# Patient Record
Sex: Male | Born: 1965 | Race: White | Hispanic: No | Marital: Married | Smoking: Former smoker
Health system: Southern US, Community
[De-identification: ages and names within clinical notes are randomized; demographics above are authoritative.]

## PROBLEM LIST (undated history)

## (undated) DIAGNOSIS — E119 Type 2 diabetes mellitus without complications: Secondary | ICD-10-CM

## (undated) DIAGNOSIS — I10 Essential (primary) hypertension: Secondary | ICD-10-CM

## (undated) HISTORY — PX: MASTOIDECTOMY: SHX711

## (undated) HISTORY — PX: WISDOM TOOTH EXTRACTION: SHX21

## (undated) HISTORY — PX: INNER EAR SURGERY: SHX679

## (undated) HISTORY — PX: KNEE SURGERY: SHX244

---

## 2002-05-29 ENCOUNTER — Encounter: Payer: Self-pay | Admitting: Otolaryngology

## 2002-05-29 ENCOUNTER — Ambulatory Visit (HOSPITAL_COMMUNITY): Admission: RE | Admit: 2002-05-29 | Discharge: 2002-05-29 | Payer: Self-pay | Admitting: Otolaryngology

## 2002-07-13 ENCOUNTER — Ambulatory Visit (HOSPITAL_BASED_OUTPATIENT_CLINIC_OR_DEPARTMENT_OTHER): Admission: RE | Admit: 2002-07-13 | Discharge: 2002-07-13 | Payer: Self-pay | Admitting: Otolaryngology

## 2002-07-13 ENCOUNTER — Encounter (INDEPENDENT_AMBULATORY_CARE_PROVIDER_SITE_OTHER): Payer: Self-pay | Admitting: *Deleted

## 2005-12-30 ENCOUNTER — Emergency Department (HOSPITAL_COMMUNITY): Admission: EM | Admit: 2005-12-30 | Discharge: 2005-12-30 | Payer: Self-pay | Admitting: Emergency Medicine

## 2008-08-17 ENCOUNTER — Emergency Department (HOSPITAL_COMMUNITY): Admission: EM | Admit: 2008-08-17 | Discharge: 2008-08-17 | Payer: Self-pay | Admitting: Emergency Medicine

## 2010-12-15 NOTE — Op Note (Signed)
NAME:  Evan Reyes, Evan Reyes                     ACCOUNT NO.:  [id number]   MEDICAL RECORD NO.:  [id number]                   PATIENT TYPE:  AMB   LOCATION:  DSC                                  FACILITY:  [facility]   PHYSICIAN:  Jefry H. Pollyann Kennedy, M.D.                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  discontinuity.  Conductive hearing loss.  Chronic otorrhea.  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  discontinuity.  Conductive hearing loss.  Chronic otorrhea.  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   PROCEDURE:  Left tympanoplasty/mastoidectomy.   SURGEON:  Jefry H. Pollyann Kennedy, M.D.   ANESTHESIA:  General endotracheal anesthesia.   COMPLICATIONS:  None.   ESTIMATED BLOOD LOSS:  25 cc.   FINDINGS:  Cholesteatoma arising from the posterior superior tympanic  membrane with erosion into the epitympanum around the ossicular chain and  into the mastoid antrum.  The facial nerve canal was completely intact.  The  Otic capsule was completely intact as well.  The incus long process was  completely eroded away. There was discontinuity between the incus body and  the stapes capitulum.  The stapes superstructure was completely surrounded  with granulation tissue and edematous mucosa, but no definite cholesteatoma.  The patient tolerated the procedure well, was awakened, extubated, and  transferred to the recovery room in stable condition.   INDICATIONS FOR PROCEDURE:  The patient is a 45 year old with a several-year  history of chronic left ear drainage and hearing loss.  Risks, benefits,  alternatives, and  complications of the procedure were explained to the  patient and his wife who seemed to understand and agreed to surgery.   DESCRIPTION OF PROCEDURE:  The patient was taken to the operating room and  placed on the operating table in the supine position.  Following the  induction of general endotracheal anesthesia, the left ear was prepped and  draped in the usual sterile fashion.  Four quadrant ear canal injections  with 1% Xylocaine with epinephrine was accomplished.  Postauricular sulcus  was also injected with the same solution.  A total of 8 cc was used.  A  vascular strip was created in the ear canal.  The postauricular incision was  performed with a #10 scalpel.  The ear was brought forward and the tissue  from overlying the temporalis fascia was harvested, pressed and dried on the  back table. The linea temporalis and mastoid periosteum were divided with  the scalpel and the periosteum was brought forward in continuity with the  vascular strip. The mastoid cortex was exposed.  A complete mastoidectomy  was  performed starting off with a large size cutting bur.  The mastoid  cortex bone was thickened up to approximately 1.5 cm.  The mastoid cells  were eventually entered and cholesteatoma matrix was identified within the  anterior superior mastoid and the antrum area.  The horizontal semicircular  canal was identified and exposed keeping the Otic capsule intact.  The  cholesteatoma was carefully dissected off of the surrounding bone. There was  a dehiscence of the tegumen, but the dura was kept intact.  The tegumen was  low compared to the superior aspect of the medial mastoid cavity.  The  cholesteatoma was located in this medial area.  It was carefully dissected  out of the surrounding bony confines and followed up into the incus fossa.  Inspection through the ear canal was then accomplished to try to visualize  the ossicular chain and the fallopian canal.  At this point, the  decision  was made to take down the posterior canal wall.  This was taken all the way  down to the fallopian canal basically which was again without any  dehiscence.  The cholesteatoma matrix was carefully dissected and followed  anteriorly.  The ossicular chain complex with cholesteatoma was dissected  off of the stapes superstructure which seemed to be free of cholesteatoma.  This was kept in place.  The incus was disarticulated from the malleus head  and was retrieved with surrounding cholesteatoma matrix.  The malleus  dimpers were then used to excise the malleus head and also separating that  from the lateral process of the tympanic membrane.  The cochleariform  process was identified and that seemed to be the anterior extent of the  cholesteatoma.  The remainder of the cholesteatoma matrix was all carefully  dissected off.  There did not appear to be any remnants of epithelium.  The  specimen was sent for pathologic evaluation.  Combination of cutting and  polishing burs were used to smooth out the entire mastoid at the tympanic  cavity.  The middle ear and mastoid cavity were capped with Gelfoam, soaked  in saline in the middle ear and soaked in Cortisporin in the mastoid cavity.  The tissue grafts were then placed beneath the tympanic membrane remnant and  draped over the facial ridge and into the mastoid cavity.  The remainder of  the mastoid cavity was packed.  A large tympanoplasty was created using  superior and inferior incisions through the incisura and tacking back the  posterior tonsil bowl and canal skin on itself using Vicryl suture.  The  postauricular incision was reapproximated with subcutaneous chromic suture.  Benzoin and Steri-Strips were applied.  The ear canal was packed with  bacitracin and a cottonball and a mastoid dressing was applied.  The patient  was then awakened, extubated, and transferred to the recovery room in good condition.                                                Jefry H. Pollyann Kennedy, M.D.    JHR/MEDQ  D:  07/13/2002  T:  07/13/2002  Job:  [id number]

## 2016-08-06 DIAGNOSIS — E119 Type 2 diabetes mellitus without complications: Secondary | ICD-10-CM | POA: Diagnosis not present

## 2016-09-22 ENCOUNTER — Encounter (HOSPITAL_COMMUNITY): Payer: Self-pay | Admitting: Emergency Medicine

## 2016-09-22 ENCOUNTER — Emergency Department (HOSPITAL_COMMUNITY)
Admission: EM | Admit: 2016-09-22 | Discharge: 2016-09-22 | Disposition: A | Discharge status: Home / Self Care | Payer: 59 | Attending: Emergency Medicine | Admitting: Emergency Medicine

## 2016-09-22 DIAGNOSIS — J111 Influenza due to unidentified influenza virus with other respiratory manifestations: Secondary | ICD-10-CM

## 2016-09-22 DIAGNOSIS — R05 Cough: Secondary | ICD-10-CM | POA: Diagnosis present

## 2016-09-22 DIAGNOSIS — E119 Type 2 diabetes mellitus without complications: Secondary | ICD-10-CM | POA: Insufficient documentation

## 2016-09-22 DIAGNOSIS — Z87891 Personal history of nicotine dependence: Secondary | ICD-10-CM | POA: Diagnosis not present

## 2016-09-22 DIAGNOSIS — I10 Essential (primary) hypertension: Secondary | ICD-10-CM | POA: Diagnosis not present

## 2016-09-22 HISTORY — DX: Type 2 diabetes mellitus without complications: E11.9

## 2016-09-22 HISTORY — DX: Essential (primary) hypertension: I10

## 2016-09-22 MED ORDER — OSELTAMIVIR PHOSPHATE 75 MG PO CAPS: 75.0000 mg | ORAL_CAPSULE | Freq: Two times a day (BID) | ORAL | 0 refills | Status: AC

## 2016-09-22 MED ORDER — OSELTAMIVIR PHOSPHATE 75 MG PO CAPS
75.0000 mg | ORAL_CAPSULE | Freq: Once | ORAL | Status: AC
  Administered 2016-09-22: 75 mg via ORAL
  Filled 2016-09-22: qty 1

## 2016-09-22 NOTE — ED Notes (Signed)
Pt reports that he has had a flu shot in ?October- is followed by Dr Sherwood GamblerFusco, is DM2, has HTN and has been having viral sx since last night at 2300  Education: ibuprofen and tylenol for fever and aches,  lights dimmed, spouse at bedside

## 2016-09-22 NOTE — ED Triage Notes (Signed)
Pt c/o generalized body aches, chills, & fever that began last night. Pt HR 127 in triage. Pt took Tylenol PTA.

## 2016-09-22 NOTE — Discharge Instructions (Signed)
As discussed, your evaluation today has been largely reassuring.  But, it is important that you monitor your condition carefully, and do not hesitate to return to the ED if you develop new, or concerning changes in your condition. ? ?Otherwise, please follow-up with your physician for appropriate ongoing care. ? ?

## 2016-09-22 NOTE — ED Provider Notes (Signed)
[facility]Y DEPT Provider Note   CSN: [id number] Arrival date & time: 09/22/16  1030     History   Chief Complaint Chief Complaint  Patient presents with  . Generalized Body Aches    HPI Evan Reyes is a 51 y.o. male.  HPI  Patient presents with about 15 hours of illness. Patient was well prior to the onset of illness. Now, he has diffuse body aches, anorexia, weakness, mild cough. Patient works as a Naval architect, states that he is generally well, however. He did receive his influenza shot 5 months ago. There is associated subjective fever. No relief with OTC medication, rest, minimal improvement with Tylenol.   Past Medical History:  Diagnosis Date  . Diabetes mellitus without complication (HCC)   . Hypertension     There are no active problems to display for this patient.   Past Surgical History:  Procedure Laterality Date  . INNER EAR SURGERY    . KNEE SURGERY    . MASTOIDECTOMY    . WISDOM TOOTH EXTRACTION         Home Medications    Prior to Admission medications   Medication Sig Start Date End Date Taking? Authorizing Provider  oseltamivir (TAMIFLU) 75 MG capsule Take 1 capsule (75 mg total) by mouth every 12 (twelve) hours. 09/22/16 09/29/16  Gerhard Munch, MD    Family History No family history on file.  Social History Social History  Substance Use Topics  . Smoking status: Former Games developer  . Smokeless tobacco: Never Used  . Alcohol use No     Allergies   Patient has no known allergies.   Review of Systems Review of Systems  Constitutional:       Per HPI, otherwise negative  HENT:       Per HPI, otherwise negative  Respiratory:       Per HPI, otherwise negative  Cardiovascular:       Per HPI, otherwise negative  Gastrointestinal: Negative for vomiting.  Endocrine:       Negative aside from HPI  Genitourinary:       Neg aside from HPI   Musculoskeletal:       Per HPI, otherwise negative  Skin: Negative.     Allergic/Immunologic: Negative for immunocompromised state.  Neurological: Positive for weakness. Negative for syncope.     Physical Exam Updated Vital Signs BP 124/99 (BP Location: Left Arm)   Pulse 113   Temp 100 F (37.8 C) (Oral)   Resp 18   Ht 6\' 2"  (1.88 m)   Wt 295 lb (133.8 kg)   SpO2 96%   BMI 37.88 kg/m   Physical Exam  Constitutional: He is oriented to person, place, and time. He appears well-developed. No distress.  HENT:  Head: Normocephalic and atraumatic.  Eyes: Conjunctivae and EOM are normal.  Cardiovascular: Regular rhythm.  Tachycardia present.   Pulmonary/Chest: Effort normal. No stridor. No respiratory distress. He has no wheezes.  Abdominal: He exhibits no distension.  Musculoskeletal: He exhibits no edema.  Neurological: He is alert and oriented to person, place, and time.  Skin: Skin is warm and dry.  Psychiatric: He has a normal mood and affect.  Nursing note and vitals reviewed.    ED Treatments / Results   Procedures Procedures (including critical care time)  Medications Ordered in ED Medications  oseltamivir (TAMIFLU) capsule 75 mg (not administered)     Initial Impression / Assessment and Plan / ED Course  I have reviewed the triage vital  signs and the nursing notes.  Pertinent labs & imaging results that were available during my care of the patient were reviewed by me and considered in my medical decision making (see chart for details).  Previously well male presents with less than 1 day of myalgia, weakness, chills, fever. Patient slightly tachycardic, but with no hypertension, and there is low suspicion for bacteremia, sepsis. Patient empirically treated for influenza. I had a lengthy conversation with patient's wife about all monitoring, return precautions, and he was discharged in stable condition.  Final Clinical Impressions(s) / ED Diagnoses   Final diagnoses:  Influenza    New Prescriptions New Prescriptions    OSELTAMIVIR (TAMIFLU) 75 MG CAPSULE    Take 1 capsule (75 mg total) by mouth every 12 (twelve) hours.     Gerhard Munchobert Brown Dunlap, MD 09/22/16 [phone]

## 2016-09-27 DIAGNOSIS — G4733 Obstructive sleep apnea (adult) (pediatric): Secondary | ICD-10-CM | POA: Diagnosis not present

## 2016-12-17 DIAGNOSIS — Z1389 Encounter for screening for other disorder: Secondary | ICD-10-CM | POA: Diagnosis not present

## 2016-12-17 DIAGNOSIS — E782 Mixed hyperlipidemia: Secondary | ICD-10-CM | POA: Diagnosis not present

## 2016-12-17 DIAGNOSIS — Z23 Encounter for immunization: Secondary | ICD-10-CM | POA: Diagnosis not present

## 2016-12-17 DIAGNOSIS — E119 Type 2 diabetes mellitus without complications: Secondary | ICD-10-CM | POA: Diagnosis not present

## 2016-12-17 DIAGNOSIS — Z0000 Encounter for general adult medical examination without abnormal findings: Secondary | ICD-10-CM | POA: Diagnosis not present

## 2017-03-18 DIAGNOSIS — S233XXA Sprain of ligaments of thoracic spine, initial encounter: Secondary | ICD-10-CM | POA: Diagnosis not present

## 2017-03-18 DIAGNOSIS — I10 Essential (primary) hypertension: Secondary | ICD-10-CM | POA: Diagnosis not present

## 2017-03-18 DIAGNOSIS — E119 Type 2 diabetes mellitus without complications: Secondary | ICD-10-CM | POA: Diagnosis not present

## 2017-03-25 ENCOUNTER — Other Ambulatory Visit (HOSPITAL_COMMUNITY): Payer: Self-pay | Admitting: Family Medicine

## 2017-03-25 DIAGNOSIS — I10 Essential (primary) hypertension: Secondary | ICD-10-CM | POA: Diagnosis not present

## 2017-03-25 DIAGNOSIS — R52 Pain, unspecified: Secondary | ICD-10-CM

## 2017-03-25 DIAGNOSIS — E119 Type 2 diabetes mellitus without complications: Secondary | ICD-10-CM | POA: Diagnosis not present

## 2017-04-04 DIAGNOSIS — G4733 Obstructive sleep apnea (adult) (pediatric): Secondary | ICD-10-CM | POA: Diagnosis not present

## 2017-06-10 DIAGNOSIS — Z23 Encounter for immunization: Secondary | ICD-10-CM | POA: Diagnosis not present

## 2017-06-16 DIAGNOSIS — A09 Infectious gastroenteritis and colitis, unspecified: Secondary | ICD-10-CM | POA: Diagnosis not present

## 2017-06-16 DIAGNOSIS — E119 Type 2 diabetes mellitus without complications: Secondary | ICD-10-CM | POA: Diagnosis not present

## 2017-06-16 DIAGNOSIS — R112 Nausea with vomiting, unspecified: Secondary | ICD-10-CM | POA: Diagnosis not present

## 2017-09-30 DIAGNOSIS — G4733 Obstructive sleep apnea (adult) (pediatric): Secondary | ICD-10-CM | POA: Diagnosis not present

## 2018-03-03 DIAGNOSIS — Z0000 Encounter for general adult medical examination without abnormal findings: Secondary | ICD-10-CM | POA: Diagnosis not present

## 2018-03-03 DIAGNOSIS — E1129 Type 2 diabetes mellitus with other diabetic kidney complication: Secondary | ICD-10-CM | POA: Diagnosis not present

## 2018-03-03 DIAGNOSIS — E559 Vitamin D deficiency, unspecified: Secondary | ICD-10-CM | POA: Diagnosis not present

## 2018-03-03 DIAGNOSIS — E782 Mixed hyperlipidemia: Secondary | ICD-10-CM | POA: Diagnosis not present

## 2018-03-03 DIAGNOSIS — E1165 Type 2 diabetes mellitus with hyperglycemia: Secondary | ICD-10-CM | POA: Diagnosis not present

## 2018-04-03 DIAGNOSIS — G4733 Obstructive sleep apnea (adult) (pediatric): Secondary | ICD-10-CM | POA: Diagnosis not present

## 2018-10-01 DIAGNOSIS — G4733 Obstructive sleep apnea (adult) (pediatric): Secondary | ICD-10-CM | POA: Diagnosis not present

## 2019-01-23 ENCOUNTER — Other Ambulatory Visit: Payer: Self-pay

## 2019-01-23 ENCOUNTER — Ambulatory Visit (HOSPITAL_COMMUNITY)
Admission: RE | Admit: 2019-01-23 | Discharge: 2019-01-23 | Disposition: A | Discharge status: Home / Self Care | Payer: 59 | Source: Ambulatory Visit | Attending: Physician Assistant | Admitting: Physician Assistant

## 2019-01-23 ENCOUNTER — Other Ambulatory Visit (HOSPITAL_COMMUNITY): Payer: Self-pay | Admitting: Physician Assistant

## 2019-01-23 DIAGNOSIS — M12862 Other specific arthropathies, not elsewhere classified, left knee: Secondary | ICD-10-CM | POA: Diagnosis not present

## 2019-08-12 ENCOUNTER — Encounter (HOSPITAL_COMMUNITY): Payer: Self-pay | Admitting: Physical Therapy

## 2019-08-12 ENCOUNTER — Ambulatory Visit (HOSPITAL_COMMUNITY): Payer: 59 | Attending: Orthopedic Surgery | Admitting: Physical Therapy

## 2019-08-12 ENCOUNTER — Other Ambulatory Visit: Payer: Self-pay

## 2019-08-12 ENCOUNTER — Encounter (INDEPENDENT_AMBULATORY_CARE_PROVIDER_SITE_OTHER): Payer: Self-pay

## 2019-08-12 DIAGNOSIS — M25562 Pain in left knee: Secondary | ICD-10-CM | POA: Insufficient documentation

## 2019-08-12 DIAGNOSIS — R262 Difficulty in walking, not elsewhere classified: Secondary | ICD-10-CM | POA: Diagnosis present

## 2019-08-12 NOTE — Therapy (Signed)
[facility] [address] [place], [place], [address] Phone: [phone]   Fax:  [phone]  Physical Therapy Evaluation  Patient Details  Name: Evan Reyes MRN: [id number] Date of Birth: June 03, 1966 Referring Provider (PT): Durene Romans   Encounter Date: 08/12/2019  PT End of Session - 08/12/19 1612    Visit Number  1    Number of Visits  1    Authorization Type  [employer], no VL or auth    Authorization Time Period  08/12/19 to 08/12/19 - one time visit    PT Start Time  1615    PT Stop Time  1700    PT Time Calculation (min)  45 min    Activity Tolerance  Patient tolerated treatment well    Behavior During Therapy  [facility] for tasks assessed/performed       Past Medical History:  Diagnosis Date  . Diabetes mellitus without complication (HCC)   . Hypertension     Past Surgical History:  Procedure Laterality Date  . INNER EAR SURGERY    . KNEE SURGERY    . MASTOIDECTOMY    . WISDOM TOOTH EXTRACTION      There were no vitals filed for this visit.   Subjective Assessment - 08/12/19 1621    Subjective  Patient reports he had gradual knee pain that started in May 2020 and this was more than his typical aches and pain. States he went to the MD and they pulled a bunch of fluid out of it in September and gave him a cortisone injection. States that he elected to have surgery and had a scope of his left knee on 07/20/19. He was up and walking a couple days after the surgery with minimal pain. He is currently walking a mile everyday and the plan is to get back to work on 08/31/2019. 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    How long can you sit comfortably?  no limitations    How long can you stand comfortably?  as long as he wants to    How long can you walk comfortably?  at least a mile    Patient Stated Goals  to know what he needs to focus on and ot be able to climb in and out of a truck    Currently in  Pain?  Yes    Pain Score  2     Pain Location  Knee    Pain Orientation  Left;Medial    Pain Descriptors / Indicators  Dull;Aching         [facility] PT Assessment - 08/12/19 0001      Assessment   Medical Diagnosis  s/p left knee scope    Referring Provider (PT)  Durene Romans    Onset Date/Surgical Date  07/20/19    Next MD Visit  08/24/19      Precautions   Precautions  None      Restrictions   Weight Bearing Restrictions  Yes    Other Position/Activity Restrictions  WBAT on left      Balance Screen   Has the patient fallen in the past 6 months  No    Has the patient had a decrease in activity level because of a fear of falling?   Yes    Is the patient reluctant to leave their home because of a fear of falling?   No      Cognition  Overall Cognitive Status  Within Functional Limits for tasks assessed      Observation/Other Assessments   Focus on Therapeutic Outcomes (FOTO)   18% limitation      ROM / Strength   AROM / PROM / Strength  AROM;Strength      AROM   AROM Assessment Site  Knee    Right/Left Knee  Right;Left    Right Knee Extension  0    Right Knee Flexion  136    Left Knee Extension  4   lacking   Left Knee Flexion  136   feel different in knee     Strength   Strength Assessment Site  Knee;Ankle;Hip    Right/Left Hip  Right;Left    Right Hip Flexion  5/5    Right Hip Extension  5/5    Right Hip ABduction  5/5    Left Hip Flexion  5/5    Left Hip Extension  5/5    Left Hip ABduction  5/5    Right/Left Knee  Right;Left    Right Knee Flexion  5/5    Right Knee Extension  5/5    Left Knee Flexion  4+/5   achiness in back of knee   Left Knee Extension  4+/5   achiness in front of knee   Right/Left Ankle  Right;Left    Right Ankle Dorsiflexion  5/5    Right Ankle Plantar Flexion  5/5    Left Ankle Dorsiflexion  5/5    Left Ankle Plantar Flexion  5/5      Ambulation/Gait   Ambulation/Gait  Yes    Ambulation/Gait Assistance  7: Independent     Stairs  Yes    Stairs Assistance  7: Independent    Stair Management Technique  No rails;Alternating pattern    Number of Stairs  4    Height of Stairs  7    Gait Comments  aable to step up and down on 12" step to mimic work Diplomatic Services operational officer Assessed  Yes      Static Standing Balance   Static Standing - Comment/# of Minutes  SLS L and R - 30 seconds with minor sway                Objective measurements completed on examination: See above findings.      [facility] Adult PT Treatment/Exercise - 08/12/19 0001      Exercises   Exercises  --   squats, deadlifts, step ups/downs-  practiced in clinic            PT Education - 08/12/19 1710    Education Details  Patient educated in current presentation and limitations. Discussed gradual return to exercise and discussed and reviewed different exercises to perform at home as well as balance exercises. Practiced in clinic    Person(s) Educated  Patient    Methods  Explanation;Demonstration    Comprehension  Verbalized understanding;Returned demonstration       PT Short Term Goals - 08/12/19 1710      PT SHORT TERM GOAL #1   Title  no short term goals as this is one time visit                Plan - 08/12/19 1714    Clinical Impression Statement  Patient presents to therapy after left knee scope on 07/20/19. He presents with great strength, ROM and minimal pain. Patient is already ambulating 1 mile a  day and can ascend and descend steps without limitations or difficulties. Patient is also able to climb up a foot high step and step back down which is required for him to return back to work. Reviewed basic exercises, importance of gradual progression and note overloading knee during rehab at home. Patient does not need therapy at this time as he was educated in home program and presents with no functional limitations at this time.    Personal Factors and Comorbidities  Age;Comorbidity 1;Comorbidity 2     Comorbidities  DB, obesity    Stability/Clinical Decision Making  Stable/Uncomplicated    Clinical Decision Making  Low    Rehab Potential  Excellent    PT Frequency  1x / week    PT Duration  --   1 week   PT Treatment/Interventions  ADLs/Self Care Home Management;Therapeutic activities;Therapeutic exercise    PT Next Visit Plan  one time visit    Consulted and Agree with Plan of Care  Patient       Patient will benefit from skilled therapeutic intervention in order to improve the following deficits and impairments:  Pain  Visit Diagnosis: Acute pain of left knee  Difficulty in walking, not elsewhere classified     Problem List There are no problems to display for this patient.  5:16 PM, 08/12/19 Jerene Pitch, DPT Physical Therapy with [facility]  [phone] office  [place] [address] [place], [place], [address] Phone: [phone]   Fax:  [phone]  Name: COVEY BALLER MRN: [id number] Date of Birth: 1966/03/01

## 2019-10-04 ENCOUNTER — Ambulatory Visit: Payer: 59 | Attending: Internal Medicine

## 2019-10-04 DIAGNOSIS — Z23 Encounter for immunization: Secondary | ICD-10-CM | POA: Insufficient documentation

## 2019-10-04 NOTE — Progress Notes (Signed)
   Covid-19 Vaccination Clinic  Name:  QURAN VASCO    MRN: [id number] DOB: [birth year]  10/04/2019  Mr. Buenger was observed post Covid-19 immunization for 15 minutes without incident. He was provided with Vaccine Information Sheet and instruction to access the V-Safe system.   Mr. Hanrahan was instructed to call 911 with any severe reactions post vaccine: Marland Kitchen Difficulty breathing  . Swelling of face and throat  . A fast heartbeat  . A bad rash all over body  . Dizziness and weakness   Immunizations Administered    Name Date Dose VIS Date Route   Pfizer COVID-19 Vaccine 10/04/2019 12:08 PM 0.3 mL 07/10/2019 Intramuscular   Manufacturer: [employer], [employer]   Lot: [id number]   NDC: [address]-5

## 2019-10-25 ENCOUNTER — Ambulatory Visit: Payer: Self-pay | Attending: Internal Medicine

## 2019-10-25 DIAGNOSIS — Z23 Encounter for immunization: Secondary | ICD-10-CM

## 2019-10-25 NOTE — Progress Notes (Signed)
   Covid-19 Vaccination Clinic  Name:  Evan Reyes    MRN: [id number] DOB: [birth year]  10/25/2019  Mr. Delancey was observed post Covid-19 immunization for 15 minutes without incident. He was provided with Vaccine Information Sheet and instruction to access the V-Safe system.   Mr. Toste was instructed to call 911 with any severe reactions post vaccine: Marland Kitchen Difficulty breathing  . Swelling of face and throat  . A fast heartbeat  . A bad rash all over body  . Dizziness and weakness   Immunizations Administered    Name Date Dose VIS Date Route   Pfizer COVID-19 Vaccine 10/25/2019 10:56 AM 0.3 mL 07/10/2019 Intramuscular   Manufacturer: [employer], [employer]   Lot: [id number]   NDC: [address]-0

## 2019-11-30 ENCOUNTER — Encounter: Payer: Self-pay | Admitting: Emergency Medicine

## 2019-11-30 ENCOUNTER — Ambulatory Visit
Admission: EM | Admit: 2019-11-30 | Discharge: 2019-11-30 | Disposition: A | Discharge status: Home / Self Care | Payer: 59

## 2019-11-30 ENCOUNTER — Other Ambulatory Visit: Payer: Self-pay

## 2019-11-30 DIAGNOSIS — M545 Low back pain, unspecified: Secondary | ICD-10-CM

## 2019-11-30 MED ORDER — KETOROLAC TROMETHAMINE 60 MG/2ML IM SOLN
60.0000 mg | Freq: Once | INTRAMUSCULAR | Status: AC
  Administered 2019-11-30: 13:00:00 60 mg via INTRAMUSCULAR

## 2019-11-30 MED ORDER — CYCLOBENZAPRINE HCL 10 MG PO TABS
10.0000 mg | ORAL_TABLET | Freq: Two times a day (BID) | ORAL | 0 refills | Status: DC | PRN
Start: 2019-11-30 — End: 2023-11-25

## 2019-11-30 MED ORDER — PREDNISONE 10 MG (21) PO TBPK
ORAL_TABLET | ORAL | 0 refills | Status: DC
Start: 2019-11-30 — End: 2023-11-25

## 2019-11-30 MED ORDER — IBUPROFEN 800 MG PO TABS: 800.0000 mg | ORAL_TABLET | Freq: Three times a day (TID) | ORAL | 0 refills | Status: AC

## 2019-11-30 MED ORDER — METHYLPREDNISOLONE SODIUM SUCC 125 MG IJ SOLR
125.0000 mg | Freq: Once | INTRAMUSCULAR | Status: AC
  Administered 2019-11-30: 13:00:00 125 mg via INTRAMUSCULAR

## 2019-11-30 NOTE — ED Provider Notes (Signed)
[facility]    CSN: [id number] Arrival date & time: 11/30/19  1049      History   Chief Complaint Chief Complaint  Patient presents with  . Back Pain    HPI Nirvan Laban Tilghman is a 54 y.o. male.   Who presented to the urgent care with a complaint of low back pain for the past 2 weeks.  Reported worsening symptoms after mowing.  Localized pain to bilateral lower back.Marland Kitchen  Described the pain as constant constant and achy and rated at 8 on a scale 1-10.  He has tried OTC medications without relief.  His symptoms are made worse with ROM.  He denies similar symptoms in the past.  Denies chills, fever, nausea, vomiting, diarrhea.  The history is provided by the patient. No language interpreter was used.  Back Pain   Past Medical History:  Diagnosis Date  . Diabetes mellitus without complication (HCC)   . Hypertension     There are no problems to display for this patient.   Past Surgical History:  Procedure Laterality Date  . INNER EAR SURGERY    . KNEE SURGERY    . MASTOIDECTOMY    . WISDOM TOOTH EXTRACTION         Home Medications    Prior to Admission medications   Medication Sig Start Date End Date Taking? Authorizing Provider  aspirin 81 MG chewable tablet Chew by mouth.    [provider]  cyclobenzaprine (FLEXERIL) 10 MG tablet Take 1 tablet (10 mg total) by mouth 2 (two) times daily as needed for muscle spasms. 11/30/19   Phila Shoaf, Zachery Dakins, FNP  glimepiride (AMARYL) 2 MG tablet Take 2 mg by mouth daily. 09/18/19   [provider]  ibuprofen (ADVIL) 800 MG tablet Take 1 tablet (800 mg total) by mouth 3 (three) times daily. 11/30/19   Berda Shelvin, Zachery Dakins, FNP  losartan (COZAAR) 50 MG tablet losartan 50 mg tablet    [provider]  Multiple Vitamins-Minerals (THERA-M) TABS Take by mouth.    [provider]  Omega-3 Fatty Acids (FISH OIL) 1000 MG CAPS Take by mouth.    [provider]  predniSONE (STERAPRED  UNI-PAK 21 TAB) 10 MG (21) TBPK tablet Take 6 tabs by mouth daily  for 2 days, then 5 tabs for 2 days, then 4 tabs for 2 days, then 3 tabs for 2 days, 2 tabs for 2 days, then 1 tab by mouth daily for 2 days 11/30/19   Durward Parcel, FNP  simvastatin (ZOCOR) 10 MG tablet Take 10 mg by mouth daily. 09/19/19   [provider]    Family History Family History  Problem Relation Age of Onset  . Multiple sclerosis Father     Social History Social History   Tobacco Use  . Smoking status: Former Games developer  . Smokeless tobacco: Never Used  Substance Use Topics  . Alcohol use: No  . Drug use: No     Allergies   Patient has no known allergies.   Review of Systems Review of Systems  Constitutional: Negative.   Respiratory: Negative.   Cardiovascular: Negative.   Musculoskeletal: Positive for back pain.  All other systems reviewed and are negative.    Physical Exam Triage Vital Signs ED Triage Vitals  Enc Vitals Group     BP 11/30/19 1130 (!) 180/90     Pulse Rate 11/30/19 1130 88     Resp 11/30/19 1130 20     Temp 11/30/19  1130 98.3 F (36.8 C)     Temp Source 11/30/19 1130 Oral     SpO2 11/30/19 1130 96 %     Weight --      Height --      Head Circumference --      Peak Flow --      Pain Score 11/30/19 1125 8     Pain Loc --      Pain Edu? --      Excl. in GC? --    No data found.  Updated Vital Signs BP (!) 180/90 (BP Location: Right Arm)   Pulse 88   Temp 98.3 F (36.8 C) (Oral)   Resp 20   SpO2 96%   Visual Acuity Right Eye Distance:   Left Eye Distance:   Bilateral Distance:    Right Eye Near:   Left Eye Near:    Bilateral Near:     Physical Exam Nursing note reviewed.  Constitutional:      General: He is not in acute distress.    Appearance: Normal appearance. He is normal weight. He is not ill-appearing, toxic-appearing or diaphoretic.  Cardiovascular:     Rate and Rhythm: Normal rate and regular rhythm.     Pulses: Normal pulses.      Heart sounds: Normal heart sounds. No murmur. No gallop.   Pulmonary:     Effort: Pulmonary effort is normal. No respiratory distress.     Breath sounds: Normal breath sounds. No stridor. No wheezing, rhonchi or rales.  Chest:     Chest wall: No tenderness.  Musculoskeletal:        General: Tenderness present. Normal range of motion.     Lumbar back: Spasms and tenderness present.     Comments: Back:  Patient ambulates from chair to exam table without difficulty.  Inspection: Skin clear and intact without obvious swelling, erythema, or ecchymosis. Warm to the touch  Palpation: Vertebral processes nontender. Tenderness and spasm to bilateral lower back ROM: Strength: 5/5 hip flexion, 5/5 knee extension, 5/5 knee flexion, 5/5 plantar flexion, 5/5 dorsiflexion  DTR: Patellar tendon reflex intact    Neurological:     Mental Status: He is alert and oriented to person, place, and time.     Cranial Nerves: No cranial nerve deficit.     Sensory: No sensory deficit.     Motor: No weakness.     Coordination: Coordination normal.     Gait: Gait normal.     Deep Tendon Reflexes: Reflexes normal.      UC Treatments / Results  Labs (all labs ordered are listed, but only abnormal results are displayed) Labs Reviewed - No data to display  EKG   Radiology No results found.  Procedures Procedures (including critical care time)  Medications Ordered in UC Medications  ketorolac (TORADOL) injection 60 mg (has no administration in time range)  methylPREDNISolone sodium succinate (SOLU-MEDROL) 125 mg/2 mL injection 125 mg (has no administration in time range)    Initial Impression / Assessment and Plan / UC Course  I have reviewed the triage vital signs and the nursing notes.  Pertinent labs & imaging results that were available during my care of the patient were reviewed by me and considered in my medical decision making (see chart for details).    Patient is stable at discharge.   There is a tenderness and spasm present on lower back.  Will prescribe prednisone, Flexeril and ibuprofen for pain relief.  Final Clinical Impressions(s) / UC  Diagnoses   Final diagnoses:  Acute bilateral low back pain without sciatica     Discharge Instructions     Rest, ice and heat as needed Ensure adequate ROM as tolerated. Prescribed ibuprofen as needed for pain relief Prednisone for inflammation Prescribed flexeril  for muscle spasm.  Do not drive or operate heavy machinery while taking this medication Return here or go to ER if you have any new or worsening symptoms such as numbness/tingling of the inner thighs, loss of bladder or bowel control, headache/blurry vision, nausea/vomiting, confusion/altered mental status, dizziness, weakness, passing out, imbalance, etc...      ED Prescriptions    Medication Sig Dispense Auth. Provider   predniSONE (STERAPRED UNI-PAK 21 TAB) 10 MG (21) TBPK tablet Take 6 tabs by mouth daily  for 2 days, then 5 tabs for 2 days, then 4 tabs for 2 days, then 3 tabs for 2 days, 2 tabs for 2 days, then 1 tab by mouth daily for 2 days 42 tablet Jarvis Knodel S, FNP   ibuprofen (ADVIL) 800 MG tablet Take 1 tablet (800 mg total) by mouth 3 (three) times daily. 30 tablet Eufemia Prindle, Darrelyn Hillock, FNP   cyclobenzaprine (FLEXERIL) 10 MG tablet Take 1 tablet (10 mg total) by mouth 2 (two) times daily as needed for muscle spasms. 20 tablet Boluwatife Mutchler, Darrelyn Hillock, FNP     PDMP not reviewed this encounter.   Emerson Monte, FNP 11/30/19 1229

## 2019-11-30 NOTE — Discharge Instructions (Addendum)
Rest, ice and heat as needed Ensure adequate ROM as tolerated. Prescribed ibuprofen as needed for pain relief Prednisone for inflammation Prescribed flexeril  for muscle spasm.  Do not drive or operate heavy machinery while taking this medication Return here or go to ER if you have any new or worsening symptoms such as numbness/tingling of the inner thighs, loss of bladder or bowel control, headache/blurry vision, nausea/vomiting, confusion/altered mental status, dizziness, weakness, passing out, imbalance, etc..Marland Kitchen

## 2019-11-30 NOTE — ED Triage Notes (Signed)
Complains of back pain.  Back pain started in February 2021.  2 weeks ago, pain worsened.  This past weekend, pain worsened significantly.  No known injury, but did mow lawn over weekend.  Pain in lower back, twinge, dull ache left leg, slightly occurs in right.

## 2021-06-05 ENCOUNTER — Other Ambulatory Visit: Payer: Self-pay

## 2021-06-05 ENCOUNTER — Ambulatory Visit (HOSPITAL_COMMUNITY)
Admission: RE | Admit: 2021-06-05 | Discharge: 2021-06-05 | Disposition: A | Discharge status: Home / Self Care | Payer: 59 | Source: Ambulatory Visit | Attending: Physician Assistant | Admitting: Physician Assistant

## 2021-06-05 ENCOUNTER — Other Ambulatory Visit (HOSPITAL_COMMUNITY): Payer: Self-pay | Admitting: Physician Assistant

## 2021-06-05 DIAGNOSIS — M25561 Pain in right knee: Secondary | ICD-10-CM | POA: Insufficient documentation

## 2021-08-21 ENCOUNTER — Other Ambulatory Visit: Payer: Self-pay

## 2021-08-21 ENCOUNTER — Ambulatory Visit (INDEPENDENT_AMBULATORY_CARE_PROVIDER_SITE_OTHER): Payer: 59 | Admitting: Behavioral Health

## 2021-08-21 ENCOUNTER — Encounter: Payer: Self-pay | Admitting: Behavioral Health

## 2021-08-21 VITALS — BP 154/84 | HR 63 | Ht 75.0 in | Wt 300.0 lb

## 2021-08-21 DIAGNOSIS — R454 Irritability and anger: Secondary | ICD-10-CM

## 2021-08-21 DIAGNOSIS — F439 Reaction to severe stress, unspecified: Secondary | ICD-10-CM | POA: Diagnosis not present

## 2021-08-21 NOTE — Progress Notes (Signed)
[facility] MD/PA/NP Initial Note  08/21/2021 9:38 AM Evan Reyes  MRN:  [id number]  Chief Complaint:  Chief Complaint   Patient Education; Establish Care; Anger; Agitation     HPI:  56 year old male presents to this office for initial visit and to establish care. Says that his wife left him without any warning signs in June of 2021. Says they eventually divorced in Oct. 2022. Says that he has never had any closure because he never contacted her and does not understand why. Says he has harbored deep seeded anger and resentment that he is having trouble getting past. He says that he recently sabotaged a new relationship by lashing out and not being able to control his negative feelings Says that he is not wanting to take any medication but needs psychotherapy. He says his anxiety is 3/10 and depression is 1/10. He is sleeping 7-8 hours per night. No mania, no psychosis, no SI/HI.  No prior psychotropic medications  Visit Diagnosis: No diagnosis found.  Past Psychiatric History: none  Past Medical History:  Past Medical History:  Diagnosis Date   Diabetes mellitus without complication ([place])    Hypertension     Past Surgical History:  Procedure Laterality Date   INNER EAR SURGERY     KNEE SURGERY     MASTOIDECTOMY     WISDOM TOOTH EXTRACTION      Family Psychiatric History: none obtained this visit  Family History:  Family History  Problem Relation Age of Onset   Multiple sclerosis Father     Social History:  Social History   Socioeconomic History   Marital status: Married    Spouse name: Not on file   Number of children: 1   Years of education: 16   Highest education level: Bachelor's degree (e.g., BA, AB, BS)  Occupational History   Not on file  Tobacco Use   Smoking status: Former   Smokeless tobacco: Never  Substance and Sexual Activity   Alcohol use: No   Drug use: No   Sexual activity: Not on file  Other Topics Concern   Not on file  Social  History Narrative   Lives in [place] alone.   Social Determinants of Health   Financial Resource Strain: Not on file  Food Insecurity: Not on file  Transportation Needs: Not on file  Physical Activity: Not on file  Stress: Not on file  Social Connections: Not on file    Allergies: No Known Allergies  Metabolic Disorder Labs: No results found for: HGBA1C, MPG No results found for: PROLACTIN No results found for: CHOL, TRIG, HDL, CHOLHDL, VLDL, LDLCALC No results found for: TSH  Therapeutic Level Labs: No results found for: LITHIUM No results found for: VALPROATE No components found for:  CBMZ  Current Medications: Current Outpatient Medications  Medication Sig Dispense Refill   aspirin 81 MG chewable tablet Chew by mouth.     cyclobenzaprine (FLEXERIL) 10 MG tablet Take 1 tablet (10 mg total) by mouth 2 (two) times daily as needed for muscle spasms. 20 tablet 0   glimepiride (AMARYL) 2 MG tablet Take 2 mg by mouth daily.     ibuprofen (ADVIL) 800 MG tablet Take 1 tablet (800 mg total) by mouth 3 (three) times daily. 30 tablet 0   losartan (COZAAR) 50 MG tablet losartan 50 mg tablet     Multiple Vitamins-Minerals (THERA-M) TABS Take by mouth.     Omega-3 Fatty Acids (FISH OIL) 1000 MG CAPS Take by mouth.  predniSONE (STERAPRED UNI-PAK 21 TAB) 10 MG (21) TBPK tablet Take 6 tabs by mouth daily  for 2 days, then 5 tabs for 2 days, then 4 tabs for 2 days, then 3 tabs for 2 days, 2 tabs for 2 days, then 1 tab by mouth daily for 2 days 42 tablet 0   simvastatin (ZOCOR) 10 MG tablet Take 10 mg by mouth daily.     No current facility-administered medications for this visit.    Medication Side Effects: none  Orders placed this visit:  No orders of the defined types were placed in this encounter.   Psychiatric Specialty Exam:  Review of Systems  Constitutional: Negative.   Allergic/Immunologic: Negative.   Neurological: Negative.   Psychiatric/Behavioral:  Positive  for behavioral problems.    Blood pressure (!) 154/84, pulse 63, height 6\' 3"  (1.905 m), weight 300 lb (136.1 kg).Body mass index is 37.5 kg/m.  General Appearance: Casual  Eye Contact:  Good  Speech:  Clear and Coherent  Volume:  Normal  Mood:  NA  Affect:  Appropriate  Thought Process:  Coherent  Orientation:  Full (Time, Place, and Person)  Thought Content: Logical   Suicidal Thoughts:  No  Homicidal Thoughts:  No  Memory:  WNL  Judgement:  Good  Insight:  Good  Psychomotor Activity:  Normal  Concentration:  Concentration: Good  Recall:  Good  Fund of Knowledge: Good  Language: Good  Assets:  Desire for Improvement  ADL's:  Intact  Cognition: WNL  Prognosis:  Good   Screenings:PH2Q=Negative  Receiving Psychotherapy: No   Treatment Plan/Recommendations:  Greater than 50% of 30 min face to face time with patient was spent on counseling and coordination of care. We discussed his current problems with coping with change post divorce and anger management.  Patient says that he thought he was here for therapy to help him cope with his anger and resentment. He does not want to take medications or continue with medication management. It was determined that psychotherapy would be more appropriate tx at this time. He was referred to [facility] for f/u.         Elwanda Brooklyn, NP

## 2021-10-02 ENCOUNTER — Other Ambulatory Visit: Payer: Self-pay

## 2021-10-02 ENCOUNTER — Ambulatory Visit (INDEPENDENT_AMBULATORY_CARE_PROVIDER_SITE_OTHER): Payer: 59 | Admitting: Mental Health

## 2021-10-02 DIAGNOSIS — F4320 Adjustment disorder, unspecified: Secondary | ICD-10-CM

## 2021-10-02 NOTE — Progress Notes (Signed)
[facility] Counselor Initial Adult Exam ? ?Name: Evan Reyes ?Date: 10/02/2021 ?MRN: [id number] ?DOB: 12-Dec-1965 ?PCP: [facility] ? ?Time spent: 53 minutes ? ?Reason for Visit /Presenting Problem: Patient referred by Avelina Laine, NP in practice for coping skills related to anger issues. 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  They have and adult daughter in college; his daughter informed his taht his ex wife was retiring. After losing money after the divorce, his anger continued to build. This past December, he was dating a lady which was going well until he got angry while communicating via text.  ?To this day, he does not know why his wife left him. They have not spoken since. When his wife left, his adult daughter went with her; they moved locally. Daughter now lives in [place], [place] obtaining Phd.  ?He continues to work as a Naval architect. He wrote a letter to his ex wife forgiving her. States he has "walls" that relate to his suppressing feelings that relate to his anger. ? ? ? ?Mental Status Exam: ?   ?Appearance:    Casual     ?Behavior:   Appropriate  ?Motor:   WNL  ?Speech/Language:    Clear and Coherent  ?Affect:   Full range   ?Mood:   Euthymic  ?Thought process:   Logical, linear, goal directed  ?Thought content:     WNL  ?Sensory/Perceptual disturbances:     none  ?Orientation:   x4  ?Attention:   Good  ?Concentration:   Good  ?Memory:   Intact  ?Fund of knowledge:    Consistent with age and development  ?Insight:     Good  ?Judgment:    Good  ?Impulse Control:   Good  ?  ? ?Reported Symptoms:  some depressed mood, anger/irritability  ? ?Risk Assessment: ?Danger to Self:  No ?Self-injurious Behavior: No ?Danger to Others: No ?Duty to Warn:no ?Physical Aggression / Violence:No  ?Access to Firearms a concern: No  ?Gang Involvement:No  ?Patient / guardian was educated about steps to take if suicide or  homicide risk level increases between visits: yes ?While future psychiatric events cannot be accurately predicted, the patient does not currently require acute inpatient psychiatric care and does not currently meet [facility] involuntary commitment criteria. ? ?Substance Abuse History: ?Current substance abuse: none ? ?Past Psychiatric History:   ?Outpatient Providers: none ?History of Psych Hospitalization: none ?Psychological Testing: none ? ?Abuse History: ?Victim - none stated ? ?Family History:  ?Raised by both parents, father passed in 40 (dxd w/ MS, patient assisted in his care up to his passing).  Sister- age 55 ? ?Family History  ?Problem Relation Age of Onset  ? Multiple sclerosis Father   ? ? ?Living situation: the patient lives alone ? ?Sexual Orientation:  Straight ? ?Relationship Status: divorced  ?            If a parent, number of children / ages: daughter-age 55 ? ?Support Systems; friends, family ? ?Financial Stress:  Yes  ? ?Income/Employment/Disability: Employment- full time truck driver ? ?Military Service: No  ? ?Educational History: ?Education: HS Diploma ? ? ?Stressors: interpersonal ? ?Strengths:  Family, employment ? ?Barriers:  none ? ?Legal History: ?Pending legal issue / charges: none ? ?Medical History/Surgical History: ?Past Medical History:  ?Diagnosis Date  ? Diabetes mellitus without complication (HCC)   ? Hypertension   ? ? ?Past Surgical History:  ?  Procedure Laterality Date  ? INNER EAR SURGERY    ? KNEE SURGERY    ? MASTOIDECTOMY    ? WISDOM TOOTH EXTRACTION    ? ? ?Medications: ?Current Outpatient Medications  ?Medication Sig Dispense Refill  ? aspirin 81 MG chewable tablet Chew by mouth.    ? cyclobenzaprine (FLEXERIL) 10 MG tablet Take 1 tablet (10 mg total) by mouth 2 (two) times daily as needed for muscle spasms. 20 tablet 0  ? glimepiride (AMARYL) 2 MG tablet Take 2 mg by mouth daily.    ? ibuprofen (ADVIL) 800 MG tablet Take 1 tablet (800 mg total) by mouth 3 (three)  times daily. 30 tablet 0  ? losartan (COZAAR) 50 MG tablet losartan 50 mg tablet    ? Multiple Vitamins-Minerals (THERA-M) TABS Take by mouth.    ? Omega-3 Fatty Acids (FISH OIL) 1000 MG CAPS Take by mouth.    ? predniSONE (STERAPRED UNI-PAK 21 TAB) 10 MG (21) TBPK tablet Take 6 tabs by mouth daily  for 2 days, then 5 tabs for 2 days, then 4 tabs for 2 days, then 3 tabs for 2 days, 2 tabs for 2 days, then 1 tab by mouth daily for 2 days 42 tablet 0  ? simvastatin (ZOCOR) 10 MG tablet Take 10 mg by mouth daily.    ? ?No current facility-administered medications for this visit.  ? ? ?No Known Allergies ? ?Diagnoses:  ?No diagnosis found. ? ?Plan of Care: TBD ? ? ?Waldron Session, [facility]  ? ? ? ?

## 2021-11-06 ENCOUNTER — Ambulatory Visit (INDEPENDENT_AMBULATORY_CARE_PROVIDER_SITE_OTHER): Payer: 59 | Admitting: Mental Health

## 2021-11-06 DIAGNOSIS — F4320 Adjustment disorder, unspecified: Secondary | ICD-10-CM

## 2021-11-06 NOTE — Progress Notes (Signed)
[facility] psychotherapy note ? ?Name: Evan Reyes ?Date: 11/06/2021 ?MRN: [id number] ?DOB: 1966-02-03 ?PCP: [facility] ? ?Time spent: 57 minutes ? ?Treatment:   ind. therapy ? ? ? ?Mental Status Exam: ?   ?Appearance:    Casual     ?Behavior:   Appropriate  ?Motor:   WNL  ?Speech/Language:    Clear and Coherent  ?Affect:   Full range   ?Mood:   Euthymic, sad  ?Thought process:   Logical, linear, goal directed  ?Thought content:     WNL  ?Sensory/Perceptual disturbances:     none  ?Orientation:   x4  ?Attention:   Good  ?Concentration:   Good  ?Memory:   Intact  ?Fund of knowledge:    Consistent with age and development  ?Insight:     Good  ?Judgment:    Good  ?Impulse Control:   Good  ?  ? ?Reported Symptoms:  some depressed mood, anger/irritability  ? ?Risk Assessment: ?Danger to Self:  No ?Self-injurious Behavior: No ?Danger to Others: No ?Duty to Warn:no ?Physical Aggression / Violence:No  ?Access to Firearms a concern: No  ?Gang Involvement:No  ?Patient / guardian was educated about steps to take if suicide or homicide risk level increases between visits: yes ?While future psychiatric events cannot be accurately predicted, the patient does not currently require acute inpatient psychiatric care and does not currently meet [facility] involuntary commitment criteria. ? ?Medical History/Surgical History: ?Past Medical History:  ?Diagnosis Date  ? Diabetes mellitus without complication (HCC)   ? Hypertension   ? ? ?Past Surgical History:  ?Procedure Laterality Date  ? INNER EAR SURGERY    ? KNEE SURGERY    ? MASTOIDECTOMY    ? WISDOM TOOTH EXTRACTION    ? ? ?Medications: ?Current Outpatient Medications  ?Medication Sig Dispense Refill  ? aspirin 81 MG chewable tablet Chew by mouth.    ? cyclobenzaprine (FLEXERIL) 10 MG tablet Take 1 tablet (10 mg total) by mouth 2 (two) times daily as needed for muscle spasms. 20 tablet 0  ? glimepiride (AMARYL) 2 MG tablet Take 2 mg by mouth daily.     ? ibuprofen (ADVIL) 800 MG tablet Take 1 tablet (800 mg total) by mouth 3 (three) times daily. 30 tablet 0  ? losartan (COZAAR) 50 MG tablet losartan 50 mg tablet    ? Multiple Vitamins-Minerals (THERA-M) TABS Take by mouth.    ? Omega-3 Fatty Acids (FISH OIL) 1000 MG CAPS Take by mouth.    ? predniSONE (STERAPRED UNI-PAK 21 TAB) 10 MG (21) TBPK tablet Take 6 tabs by mouth daily  for 2 days, then 5 tabs for 2 days, then 4 tabs for 2 days, then 3 tabs for 2 days, 2 tabs for 2 days, then 1 tab by mouth daily for 2 days 42 tablet 0  ? simvastatin (ZOCOR) 10 MG tablet Take 10 mg by mouth daily.    ? ?No current facility-administered medications for this visit.  ? ? ? ? ?Subjective:   ?Patient presents for session on time.  Assess progress, events since his initial visit.  He shared more history relating to family, reviewing how his father passed away when he was age 43, having to care for him due to his father having a terminal medical diagnosis.  He stated that his mother and sister relied on him during that time.  He continues to identify unresolved feelings of anger due to the loss, continues to feel that he has  not fully allowed himself to grieve and experience the feelings related to this loss.  He went on to share more interpersonal related issues in terms of his sexuality and other associated behaviors in adolescence that he was shamed for by his parents.  Patient expressed no doubt that his parents loved him but the persistent lack of acceptance in these areas persisted.  His mother was still alive, at age 73.  He talks to her typically once per week, talks to his sister about once or twice per year.  How to cope and care for himself in identifying self supportive thoughts and working to increase his awareness of any thoughts of self judgment or criticism was discussed. ? ? ?Diagnoses:  ?  ICD-10-CM   ?1. Adjustment disorder, unspecified type  F43.20   ?  ? ? ? ? ? ? ? ?Plan:  Patient is to utilize coping  skills as discussed in session, utilize his support system, maintain his level of functioning by getting adequate rest. ? ?Long-term goals: ?  ?Maintain symptom reduction: The patient will report sustained reduction in symptoms of depression, irritability using both CBT and mindfulness interventions. ?Improve emotional regulation: The patient will learn and apply CBT and mindfulness-based strategies to regulate emotions such as his tendency to get angry and report an improvement in emotional regulation for at least 3 consecutive months progressively. ?  ?Short-term goal:  ?The patient will learn and apply CBT and mindfulness-based coping skills for managing emotional distress and practice using it between sessions. ?      2.   The patient will CBT and mindfulness-based interventions to increase awareness of negative thought patterns and work to reframe them as needed. ?      3.   The patient will explore and identify cognitions that add to his feelings of unresolved anger and sadness ?       ? ? ? ?Waldron Session, [facility]  ? ? ? ?

## 2021-11-27 ENCOUNTER — Ambulatory Visit (INDEPENDENT_AMBULATORY_CARE_PROVIDER_SITE_OTHER): Payer: 59 | Admitting: Mental Health

## 2021-11-27 DIAGNOSIS — F4320 Adjustment disorder, unspecified: Secondary | ICD-10-CM | POA: Diagnosis not present

## 2021-11-27 NOTE — Progress Notes (Signed)
[facility] note ? ?Name: Evan Reyes ?Date: 11/27/2021 ?MRN: [id number] ?DOB: 1965-11-13 ?PCP: [facility] ? ?Time spent: 50 minutes ? ?Treatment:   ind. therapy ? ?Mental Status Exam: ?   ?Appearance:    Casual     ?Behavior:   Appropriate  ?Motor:   WNL  ?Speech/Language:    Clear and Coherent  ?Affect:   Full range   ?Mood:   Euthymic, sad  ?Thought process:   Logical, linear, goal directed  ?Thought content:     WNL  ?Sensory/Perceptual disturbances:     none  ?Orientation:   x4  ?Attention:   Good  ?Concentration:   Good  ?Memory:   Intact  ?Fund of knowledge:    Consistent with age and development  ?Insight:     Good  ?Judgment:    Good  ?Impulse Control:   Good  ?  ? ?Reported Symptoms:  some depressed mood, anger/irritability  ? ?Risk Assessment: ?Danger to Self:  No ?Self-injurious Behavior: No ?Danger to Others: No ?Duty to Warn:no ?Physical Aggression / Violence:No  ?Access to Firearms a concern: No  ?Gang Involvement:No  ?Patient / guardian was educated about steps to take if suicide or homicide risk level increases between visits: yes ?While future psychiatric events cannot be accurately predicted, the patient does not currently require acute inpatient psychiatric care and does not currently meet [facility] involuntary commitment criteria. ? ?Medical History/Surgical History: ?Past Medical History:  ?Diagnosis Date  ? Diabetes mellitus without complication (HCC)   ? Hypertension   ? ? ?Past Surgical History:  ?Procedure Laterality Date  ? INNER EAR SURGERY    ? KNEE SURGERY    ? MASTOIDECTOMY    ? WISDOM TOOTH EXTRACTION    ? ? ?Medications: ?Current Outpatient Medications  ?Medication Sig Dispense Refill  ? aspirin 81 MG chewable tablet Chew by mouth.    ? cyclobenzaprine (FLEXERIL) 10 MG tablet Take 1 tablet (10 mg total) by mouth 2 (two) times daily as needed for muscle spasms. 20 tablet 0  ? glimepiride (AMARYL) 2 MG tablet Take 2 mg by mouth daily.    ?  ibuprofen (ADVIL) 800 MG tablet Take 1 tablet (800 mg total) by mouth 3 (three) times daily. 30 tablet 0  ? losartan (COZAAR) 50 MG tablet losartan 50 mg tablet    ? Multiple Vitamins-Minerals (THERA-M) TABS Take by mouth.    ? Omega-3 Fatty Acids (FISH OIL) 1000 MG CAPS Take by mouth.    ? predniSONE (STERAPRED UNI-PAK 21 TAB) 10 MG (21) TBPK tablet Take 6 tabs by mouth daily  for 2 days, then 5 tabs for 2 days, then 4 tabs for 2 days, then 3 tabs for 2 days, 2 tabs for 2 days, then 1 tab by mouth daily for 2 days 42 tablet 0  ? simvastatin (ZOCOR) 10 MG tablet Take 10 mg by mouth daily.    ? ?No current facility-administered medications for this visit.  ? ? ? ? ?Subjective:   ?Patient presents for session on time.  He shared recent events, how he and his girlfriend broke up. He stated that she told him that she wanted to be friends. He standing that she was there for him during a very difficult time in his life following the separation from his wife. At this point, he is uncertain of how their interactions they will experience as a result of their having the same friend group. The connection between the loss of this  relationship was paralleled to his marriage ending, albeit stated that the experience of his wife leaving him caused considerably more hurt and how they have not spoken to this day since that time. He shared how he has only three or four people in his life that have been inside his innermost "wall" as he put it metaphorically. Through guided Discovery, he identified the challenges of being his authentic self, which in part consists of his embracing his bisexuality.  ? ? ? ?Interventions:  CBT, supportive therapy ? ? ?Diagnoses:  ?  ICD-10-CM   ?1. Adjustment disorder, unspecified type  F43.20   ?  ? ? ? ? ?Plan:  Patient is to utilize coping skills as discussed in session, utilize his support system, maintain his level of functioning by getting adequate rest. ? ?Long-term goals: ?  ?Maintain symptom  reduction: The patient will report sustained reduction in symptoms of depression, irritability using both CBT and mindfulness interventions. ?Improve emotional regulation: The patient will learn and apply CBT and mindfulness-based strategies to regulate emotions such as his tendency to get angry and report an improvement in emotional regulation for at least 3 consecutive months progressively. ?  ?Short-term goal:  ?The patient will learn and apply CBT and mindfulness-based coping skills for managing emotional distress and practice using it between sessions. ?      2.   The patient will CBT and mindfulness-based interventions to increase awareness of negative thought patterns and work to reframe them as needed. ?      3.   The patient will explore and identify cognitions that add to his feelings of unresolved anger and sadness ?       ? ? ? ?[place], [facility]  ? ? ? ?

## 2021-12-11 ENCOUNTER — Ambulatory Visit: Payer: 59 | Admitting: Mental Health

## 2022-01-15 ENCOUNTER — Ambulatory Visit: Payer: 59 | Admitting: Mental Health

## 2022-01-29 ENCOUNTER — Ambulatory Visit (INDEPENDENT_AMBULATORY_CARE_PROVIDER_SITE_OTHER): Payer: 59 | Admitting: Mental Health

## 2022-01-29 DIAGNOSIS — F4320 Adjustment disorder, unspecified: Secondary | ICD-10-CM | POA: Diagnosis not present

## 2022-01-29 NOTE — Progress Notes (Signed)
[facility] note  Name: Evan Reyes Date: 01/29/2022 MRN: [id number] DOB: February 14, 1966 PCP: [facility]  Time spent: 51 minutes  Treatment:   ind. therapy  Mental Status Exam:    Appearance:    Casual     Behavior:   Appropriate  Motor:   WNL  Speech/Language:    Clear and Coherent  Affect:   Full range   Mood:   Euthymic  Thought process:   Logical, linear, goal directed  Thought content:     WNL  Sensory/Perceptual disturbances:     none  Orientation:   x4  Attention:   Good  Concentration:   Good  Memory:   Intact  Fund of knowledge:    Consistent with age and development  Insight:     Good  Judgment:    Good  Impulse Control:   Good     Reported Symptoms:  some depressed mood, anger/irritability   Risk Assessment: Danger to Self:  No Self-injurious Behavior: No Danger to Others: No Duty to Warn:no Physical Aggression / Violence:No  Access to Firearms a concern: No  Gang Involvement:No  Patient / guardian was educated about steps to take if suicide or homicide risk level increases between visits: yes While future psychiatric events cannot be accurately predicted, the patient does not currently require acute inpatient psychiatric care and does not currently meet [facility] involuntary commitment criteria.  Medical History/Surgical History: Past Medical History:  Diagnosis Date   Diabetes mellitus without complication (HCC)    Hypertension     Past Surgical History:  Procedure Laterality Date   INNER EAR SURGERY     KNEE SURGERY     MASTOIDECTOMY     WISDOM TOOTH EXTRACTION      Medications: Current Outpatient Medications  Medication Sig Dispense Refill   aspirin 81 MG chewable tablet Chew by mouth.     cyclobenzaprine (FLEXERIL) 10 MG tablet Take 1 tablet (10 mg total) by mouth 2 (two) times daily as needed for muscle spasms. 20 tablet 0   glimepiride (AMARYL) 2 MG tablet Take 2 mg by mouth daily.      ibuprofen (ADVIL) 800 MG tablet Take 1 tablet (800 mg total) by mouth 3 (three) times daily. 30 tablet 0   losartan (COZAAR) 50 MG tablet losartan 50 mg tablet     Multiple Vitamins-Minerals (THERA-M) TABS Take by mouth.     Omega-3 Fatty Acids (FISH OIL) 1000 MG CAPS Take by mouth.     predniSONE (STERAPRED UNI-PAK 21 TAB) 10 MG (21) TBPK tablet Take 6 tabs by mouth daily  for 2 days, then 5 tabs for 2 days, then 4 tabs for 2 days, then 3 tabs for 2 days, 2 tabs for 2 days, then 1 tab by mouth daily for 2 days 42 tablet 0   simvastatin (ZOCOR) 10 MG tablet Take 10 mg by mouth daily.     No current facility-administered medications for this visit.      Subjective:   Patient presents for session on time.  It has been approximately 2 months since his last visit.  Facilitated patient identifying needs as well as relevant recent events.  He stated that he was able to speak to a woman with whom he has friends and who was helpful during the separation from his wife.  He stated they were able to talk through some recent issues, he stated that she admitted to him that she felt uncomfortable due to his looking angry  in one of their past interactions.  He went on to share how he wants to work through and not "carry my anger".  Facilitated his identifying further providing detail related to past life events that he feels contributes to his unresolved anger.  Family issues, related to the loss of his father, not feeling that he fully was able to grieve this loss due to other events transpiring during within the year of his passing.  He went on to share how his family had to cope with a lawsuit at that time which he feels interfered with his ability to move through the grief process.  Other experiences socially, in middle school were also identified.  Facilitated patient identifying thoughts and feelings that he feels maintain his anger and pain.  Encouraged journaling between sessions.    Interventions:  CBT,  supportive therapy   Diagnoses:    ICD-10-CM   1. Adjustment disorder, unspecified type  F43.20          Plan:  Patient is to utilize coping skills as discussed in session, utilize his support system, maintain his level of functioning by getting adequate rest.  Long-term goals:   Maintain symptom reduction: The patient will report sustained reduction in symptoms of depression, irritability using both CBT and mindfulness interventions. Improve emotional regulation: The patient will learn and apply CBT and mindfulness-based strategies to regulate emotions such as his tendency to get angry and report an improvement in emotional regulation for at least 3 consecutive months progressively.   Short-term goal:  The patient will learn and apply CBT and mindfulness-based coping skills for managing emotional distress and practice using it between sessions.       2.   The patient will CBT and mindfulness-based interventions to increase awareness of negative thought patterns and work to reframe them as needed.       3.   The patient will explore and identify cognitions that add to his feelings of unresolved anger and sadness           Waldron Session, [facility]

## 2022-03-05 ENCOUNTER — Ambulatory Visit (INDEPENDENT_AMBULATORY_CARE_PROVIDER_SITE_OTHER): Payer: 59 | Admitting: Mental Health

## 2022-03-05 DIAGNOSIS — F4320 Adjustment disorder, unspecified: Secondary | ICD-10-CM | POA: Diagnosis not present

## 2022-03-05 NOTE — Progress Notes (Signed)
[facility] note  Name: Evan Reyes Date: 03/05/2022 MRN: [id number] DOB: [birth year] PCP: [facility]  Time spent: 50 minutes  Treatment:   ind. therapy  Mental Status Exam:    Appearance:    Casual     Behavior:   Appropriate  Motor:   WNL  Speech/Language:    Clear and Coherent  Affect:   Full range   Mood:   Euthymic  Thought process:   Logical, linear, goal directed  Thought content:     WNL  Sensory/Perceptual disturbances:     none  Orientation:   x4  Attention:   Good  Concentration:   Good  Memory:   Intact  Fund of knowledge:    Consistent with age and development  Insight:     Good  Judgment:    Good  Impulse Control:   Good     Reported Symptoms:  some depressed mood, anger/irritability   Risk Assessment: Danger to Self:  No Self-injurious Behavior: No Danger to Others: No Duty to Warn:no Physical Aggression / Violence:No  Access to Firearms a concern: No  Gang Involvement:No  Patient / guardian was educated about steps to take if suicide or homicide risk level increases between visits: yes While future psychiatric events cannot be accurately predicted, the patient does not currently require acute inpatient psychiatric care and does not currently meet [facility] involuntary commitment criteria.  Medical History/Surgical History: Past Medical History:  Diagnosis Date   Diabetes mellitus without complication (HCC)    Hypertension     Past Surgical History:  Procedure Laterality Date   INNER EAR SURGERY     KNEE SURGERY     MASTOIDECTOMY     WISDOM TOOTH EXTRACTION      Medications: Current Outpatient Medications  Medication Sig Dispense Refill   aspirin 81 MG chewable tablet Chew by mouth.     cyclobenzaprine (FLEXERIL) 10 MG tablet Take 1 tablet (10 mg total) by mouth 2 (two) times daily as needed for muscle spasms. 20 tablet 0   glimepiride (AMARYL) 2 MG tablet Take 2 mg by mouth daily.      ibuprofen (ADVIL) 800 MG tablet Take 1 tablet (800 mg total) by mouth 3 (three) times daily. 30 tablet 0   losartan (COZAAR) 50 MG tablet losartan 50 mg tablet     Multiple Vitamins-Minerals (THERA-M) TABS Take by mouth.     Omega-3 Fatty Acids (FISH OIL) 1000 MG CAPS Take by mouth.     predniSONE (STERAPRED UNI-PAK 21 TAB) 10 MG (21) TBPK tablet Take 6 tabs by mouth daily  for 2 days, then 5 tabs for 2 days, then 4 tabs for 2 days, then 3 tabs for 2 days, 2 tabs for 2 days, then 1 tab by mouth daily for 2 days 42 tablet 0   simvastatin (ZOCOR) 10 MG tablet Take 10 mg by mouth daily.     No current facility-administered medications for this visit.      Subjective:   Patient presents for session on time and no distress.  Assess progress and relevant events.  Patient shared details related to his recent mood referring to having "ups and downs".  Facilitated his further exploring these feelings with subsequent thoughts associated where he went on to share how he continues to have unresolved feelings of anger and frustration, feeling "displaced" referring to the last relationship he had that ended about 2 months ago combined with he and his wife separating last year after  30 years of marriage.  Continue to work with patient in identifying current and past experiences, not being able to fully express aspects of his sexuality during his marriage as well as not feeling accepted by peers both in childhood and adolescence as well as adulthood and various ways;  identified feeling a sense of "alienation".  Continue to encourage journaling between sessions with a focus on further identifying unresolved feelings.   Interventions:  CBT, supportive therapy   Diagnoses:    ICD-10-CM   1. Adjustment disorder, unspecified type  F43.20           Plan:  Patient is to utilize coping skills as discussed in session, utilize his support system, maintain his level of functioning by getting adequate rest,  journal between sessions, continue to utilize his support system.  Long-term goals:   Maintain symptom reduction: The patient will report sustained reduction in symptoms of depression, irritability using both CBT and mindfulness interventions. Improve emotional regulation: The patient will learn and apply CBT and mindfulness-based strategies to regulate emotions such as his tendency to get angry and report an improvement in emotional regulation for at least 3 consecutive months progressively.   Short-term goal:  The patient will learn and apply CBT and mindfulness-based coping skills for managing emotional distress and practice using it between sessions.       2.   The patient will CBT and mindfulness-based interventions to increase awareness of negative thought patterns and work to reframe them as needed.       3.   The patient will explore and identify cognitions that add to his feelings of unresolved anger and sadness           Waldron Session, [facility]

## 2022-03-19 ENCOUNTER — Ambulatory Visit: Payer: 59 | Admitting: Mental Health

## 2022-04-09 ENCOUNTER — Ambulatory Visit (INDEPENDENT_AMBULATORY_CARE_PROVIDER_SITE_OTHER): Payer: 59 | Admitting: Mental Health

## 2022-04-09 DIAGNOSIS — F4320 Adjustment disorder, unspecified: Secondary | ICD-10-CM

## 2022-04-09 NOTE — Progress Notes (Signed)
[facility] note  Name: Evan Reyes Date: 04/09/2022 MRN: [id number] DOB: 1966/05/12 PCP: [facility]  Time spent: 51 minutes  Treatment:   ind. therapy  Mental Status Exam:    Appearance:    Casual     Behavior:   Appropriate  Motor:   WNL  Speech/Language:    Clear and Coherent  Affect:   Full range   Mood:   Euthymic  Thought process:   Logical, linear, goal directed  Thought content:     WNL  Sensory/Perceptual disturbances:     none  Orientation:   x4  Attention:   Good  Concentration:   Good  Memory:   Intact  Fund of knowledge:    Consistent with age and development  Insight:     Good  Judgment:    Good  Impulse Control:   Good     Reported Symptoms:  some depressed mood, anger/irritability   Risk Assessment: Danger to Self:  No Self-injurious Behavior: No Danger to Others: No Duty to Warn:no Physical Aggression / Violence:No  Access to Firearms a concern: No  Gang Involvement:No  Patient / guardian was educated about steps to take if suicide or homicide risk level increases between visits: yes While future psychiatric events cannot be accurately predicted, the patient does not currently require acute inpatient psychiatric care and does not currently meet [facility] involuntary commitment criteria.   Medications: Current Outpatient Medications  Medication Sig Dispense Refill   aspirin 81 MG chewable tablet Chew by mouth.     cyclobenzaprine (FLEXERIL) 10 MG tablet Take 1 tablet (10 mg total) by mouth 2 (two) times daily as needed for muscle spasms. 20 tablet 0   glimepiride (AMARYL) 2 MG tablet Take 2 mg by mouth daily.     ibuprofen (ADVIL) 800 MG tablet Take 1 tablet (800 mg total) by mouth 3 (three) times daily. 30 tablet 0   losartan (COZAAR) 50 MG tablet losartan 50 mg tablet     Multiple Vitamins-Minerals (THERA-M) TABS Take by mouth.     Omega-3 Fatty Acids (FISH OIL) 1000 MG CAPS Take by mouth.      predniSONE (STERAPRED UNI-PAK 21 TAB) 10 MG (21) TBPK tablet Take 6 tabs by mouth daily  for 2 days, then 5 tabs for 2 days, then 4 tabs for 2 days, then 3 tabs for 2 days, 2 tabs for 2 days, then 1 tab by mouth daily for 2 days 42 tablet 0   simvastatin (ZOCOR) 10 MG tablet Take 10 mg by mouth daily.     No current facility-administered medications for this visit.      Subjective:   Patient presents for session on time and no distress.  Assessed progress, recent events.  Collaboratively, explored both past events growing up losing his father to multiple sclerosis as well as other significant life events that contribute to his ongoing feelings of depression.  He denies feeling severely depressed, has difficulty defining his mood.  He was able to process past painful events, specifically that of when his late wife left him and how he currently continues to feel like he is making progress and moving past the pain.  He shared how he spoke with his daughter recently about wanting to have a future relationship with her mother, 1 that would simply exist to where they can communicate politely, cordially for the sake of their daughter.  Patient stated he does not want his daughter to feel like she is "  in the middle" where she confirmed with him recently that she did feel this way often.  Patient identified how he continues to work on identifying thoughts that connect to the confusion and loss of his marital relationship which ended abruptly when she left him 2 years ago.   Interventions:  CBT, supportive therapy   Diagnoses:    ICD-10-CM   1. Adjustment disorder, unspecified type  F43.20        Plan:  Patient is to utilize coping skills as discussed in session, utilize his support system, maintain his level of functioning by getting adequate rest, journal between sessions, continue to utilize his support system.  Long-term goals:   Maintain symptom reduction: The patient will report sustained  reduction in symptoms of depression, irritability using both CBT and mindfulness interventions. Improve emotional regulation: The patient will learn and apply CBT and mindfulness-based strategies to regulate emotions such as his tendency to get angry and report an improvement in emotional regulation for at least 3 consecutive months progressively.   Short-term goal:  The patient will learn and apply CBT and mindfulness-based coping skills for managing emotional distress and practice using it between sessions.       2.   The patient will CBT and mindfulness-based interventions to increase awareness of negative thought patterns and work to reframe them as needed.       3.   The patient will explore and identify cognitions that add to his feelings of unresolved anger and sadness           Waldron Session, [facility]

## 2022-05-07 ENCOUNTER — Ambulatory Visit (INDEPENDENT_AMBULATORY_CARE_PROVIDER_SITE_OTHER): Payer: 59 | Admitting: Mental Health

## 2022-05-07 DIAGNOSIS — F4320 Adjustment disorder, unspecified: Secondary | ICD-10-CM | POA: Diagnosis not present

## 2022-05-07 NOTE — Progress Notes (Signed)
[facility]  Name: Evan Reyes Date: 05/07/2022 MRN: [id number] DOB: [birth year] PCP: [facility]  Time spent: 50 minutes  Treatment:   ind. therapy  Mental Status Exam:    Appearance:    Casual     Behavior:   Appropriate  Motor:   WNL  Speech/Language:    Clear and Coherent  Affect:   Full range   Mood:   Euthymic  Thought process:   Logical, linear, goal directed  Thought content:     WNL  Sensory/Perceptual disturbances:     none  Orientation:   x4  Attention:   Good  Concentration:   Good  Memory:   Intact  Fund of knowledge:    Consistent with age and development  Insight:     Good  Judgment:    Good  Impulse Control:   Good     Reported Symptoms:  some depressed mood, anger/irritability   Risk Assessment: Danger to Self:  No Self-injurious Behavior: No Danger to Others: No Duty to Warn:no Physical Aggression / Violence:No  Access to Firearms a concern: No  Gang Involvement:No  Patient / guardian was educated about steps to take if suicide or homicide risk level increases between visits: yes While future psychiatric events cannot be accurately predicted, the patient does not currently require acute inpatient psychiatric care and does not currently meet [facility] involuntary commitment criteria.   Medications: Current Outpatient Medications  Medication Sig Dispense Refill   aspirin 81 MG chewable tablet Chew by mouth.     cyclobenzaprine (FLEXERIL) 10 MG tablet Take 1 tablet (10 mg total) by mouth 2 (two) times daily as needed for muscle spasms. 20 tablet 0   glimepiride (AMARYL) 2 MG tablet Take 2 mg by mouth daily.     ibuprofen (ADVIL) 800 MG tablet Take 1 tablet (800 mg total) by mouth 3 (three) times daily. 30 tablet 0   losartan (COZAAR) 50 MG tablet losartan 50 mg tablet     Multiple Vitamins-Minerals (THERA-M) TABS Take by mouth.     Omega-3 Fatty Acids (FISH OIL) 1000 MG CAPS Take by mouth.      predniSONE (STERAPRED UNI-PAK 21 TAB) 10 MG (21) TBPK tablet Take 6 tabs by mouth daily  for 2 days, then 5 tabs for 2 days, then 4 tabs for 2 days, then 3 tabs for 2 days, 2 tabs for 2 days, then 1 tab by mouth daily for 2 days 42 tablet 0   simvastatin (ZOCOR) 10 MG tablet Take 10 mg by mouth daily.     No current facility-administered medications for this visit.      Subjective:   Patient presents for session on time and no distress.  Patient shared progress, recent events.  He stated that he has started a new relationship, reports it is going well thus far.  He went on to share how he continues to have no contact with his ex-wife.  Reviewed some content from previous session related to him wanting to discuss with his daughter his being open to having a cordial, respectful communication with his ex-wife going forward as he wants to be able to do this for his daughter.  He continues to express feeling like his daughter is "in the middle" of their relationship, particularly due to his ex-wife's actions prior to their separation.  Through guided discovery, he identified how he feels that he has closure about this relationship, although he does still verbalize the value of potentially  having a discussion at some point due to her not giving him any communication when they separated, or just leaving abruptly.  He plans to visit his daughter later this week for 1 week period.  Facilitated his further identifying feelings throughout session related to the adjustments in relationships and identifying needs.  Interventions:  supportive therapy, motivational interviewing   Diagnoses:    ICD-10-CM   1. Adjustment disorder, unspecified type  F43.20         Plan:  Patient is to utilize coping skills as discussed in session, utilize his support system, maintain his level of functioning by getting adequate rest, journal between sessions, continue to utilize his support system.  Long-term goals:    Maintain symptom reduction: The patient will report sustained reduction in symptoms of depression, irritability using both CBT and mindfulness interventions. Improve emotional regulation: The patient will learn and apply CBT and mindfulness-based strategies to regulate emotions such as his tendency to get angry and report an improvement in emotional regulation for at least 3 consecutive months progressively.   Short-term goal:  The patient will learn and apply CBT and mindfulness-based coping skills for managing emotional distress and practice using it between sessions.       2.   The patient will CBT and mindfulness-based interventions to increase awareness of negative thought patterns and work to reframe them as needed.       3.   The patient will explore and identify cognitions that add to his feelings of unresolved anger and sadness           Anson [place], [facility]

## 2022-06-11 ENCOUNTER — Ambulatory Visit (INDEPENDENT_AMBULATORY_CARE_PROVIDER_SITE_OTHER): Payer: 59 | Admitting: Mental Health

## 2022-06-11 DIAGNOSIS — F4320 Adjustment disorder, unspecified: Secondary | ICD-10-CM

## 2022-06-11 NOTE — Progress Notes (Signed)
[facility]  Name: Evan Reyes Date: 06/11/2022 MRN: [id number] DOB: [birth year] PCP: [facility], [place]  Time spent: 49 minutes  Treatment:   ind. therapy  Mental Status Exam:    Appearance:    Casual     Behavior:   Appropriate  Motor:   WNL  Speech/Language:    Clear and Coherent  Affect:   Full range   Mood:   Euthymic  Thought process:   Logical, linear, goal directed  Thought content:     WNL  Sensory/Perceptual disturbances:     none  Orientation:   x4  Attention:   Good  Concentration:   Good  Memory:   Intact  Fund of knowledge:    Consistent with age and development  Insight:     Good  Judgment:    Good  Impulse Control:   Good     Reported Symptoms:  some depressed mood, anger/irritability   Risk Assessment: Danger to Self:  No Self-injurious Behavior: No Danger to Others: No Duty to Warn:no Physical Aggression / Violence:No  Access to Firearms a concern: No  Gang Involvement:No  Patient / guardian was educated about steps to take if suicide or homicide risk level increases between visits: yes While future psychiatric events cannot be accurately predicted, the patient does not currently require acute inpatient psychiatric care and does not currently meet [facility] involuntary commitment criteria.   Medications: Current Outpatient Medications  Medication Sig Dispense Refill   aspirin 81 MG chewable tablet Chew by mouth.     cyclobenzaprine (FLEXERIL) 10 MG tablet Take 1 tablet (10 mg total) by mouth 2 (two) times daily as needed for muscle spasms. 20 tablet 0   glimepiride (AMARYL) 2 MG tablet Take 2 mg by mouth daily.     ibuprofen (ADVIL) 800 MG tablet Take 1 tablet (800 mg total) by mouth 3 (three) times daily. 30 tablet 0   losartan (COZAAR) 50 MG tablet losartan 50 mg tablet     Multiple Vitamins-Minerals (THERA-M) TABS Take by mouth.     Omega-3 Fatty Acids (FISH OIL) 1000 MG CAPS Take by mouth.      predniSONE (STERAPRED UNI-PAK 21 TAB) 10 MG (21) TBPK tablet Take 6 tabs by mouth daily  for 2 days, then 5 tabs for 2 days, then 4 tabs for 2 days, then 3 tabs for 2 days, 2 tabs for 2 days, then 1 tab by mouth daily for 2 days 42 tablet 0   simvastatin (ZOCOR) 10 MG tablet Take 10 mg by mouth daily.     No current facility-administered medications for this visit.      Subjective:   Patient presents for session a few minutes late.  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  He stated that his mother and sister plan to visit family out of state but he was not [place] recently and therefore he cannot attend due to his work schedule.  He stated this is somewhat common, feels he is "an afterthought".  He stated he talks to his mother about once per week, has limited communication with his sister, going on to share some history related to the relationship which is somewhat strained.  Facilitated his terrifying subsequent thoughts, feelings related where he is trying to "let go" of any animosity however, he feels his sister has difficulty with this, often being competitive in nature with him and at times judgmental where patient gave  examples of some past discussions.  Facilitated also his efforts to care for himself by how he needs to frame the relationship in his mind where he values his ability of not worrying about the expectations and opinions of others particularly if it is judgmental, which he states his sister can often be.   Interventions:  supportive therapy, motivational interviewing, CBT   Diagnoses:    ICD-10-CM   1. Adjustment disorder, unspecified type  F43.20          Plan:  Patient is to utilize coping skills as discussed in session, utilize his support system, maintain his level of functioning by getting adequate rest, journal between sessions, continue to utilize his support system.  Long-term goals:   Maintain symptom  reduction: The patient will report sustained reduction in symptoms of depression, irritability using both CBT and mindfulness interventions. Improve emotional regulation: The patient will learn and apply CBT and mindfulness-based strategies to regulate emotions such as his tendency to get angry and report an improvement in emotional regulation for at least 3 consecutive months progressively.   Short-term goal:  The patient will learn and apply CBT and mindfulness-based coping skills for managing emotional distress and practice using it between sessions.       2.   The patient will CBT and mindfulness-based interventions to increase awareness of negative thought patterns and work to reframe them as needed.       3.   The patient will explore and identify cognitions that add to his feelings of unresolved anger and sadness           Waldron Session, [facility]

## 2022-07-16 ENCOUNTER — Ambulatory Visit (INDEPENDENT_AMBULATORY_CARE_PROVIDER_SITE_OTHER): Payer: 59 | Admitting: Mental Health

## 2022-07-16 DIAGNOSIS — F4320 Adjustment disorder, unspecified: Secondary | ICD-10-CM

## 2022-07-16 NOTE — Progress Notes (Signed)
[facility] note  Name: Evan Reyes Date: 07/16/2022 MRN: [id number] DOB: 1965/08/20 PCP: [facility]  Time spent: 50 minutes  Treatment:   ind. therapy  Mental Status Exam:    Appearance:    Casual     Behavior:   Appropriate  Motor:   WNL  Speech/Language:    Clear and Coherent  Affect:   Full range   Mood:   Euthymic  Thought process:   Logical, linear, goal directed  Thought content:     WNL  Sensory/Perceptual disturbances:     none  Orientation:   x4  Attention:   Good  Concentration:   Good  Memory:   Intact  Fund of knowledge:    Consistent with age and development  Insight:     Good  Judgment:    Good  Impulse Control:   Good     Reported Symptoms:  some depressed mood, anger/irritability   Risk Assessment: Danger to Self:  No Self-injurious Behavior: No Danger to Others: No Duty to Warn:no Physical Aggression / Violence:No  Access to Firearms a concern: No  Gang Involvement:No  Patient / guardian was educated about steps to take if suicide or homicide risk level increases between visits: yes While future psychiatric events cannot be accurately predicted, the patient does not currently require acute inpatient psychiatric care and does not currently meet [facility] involuntary commitment criteria.   Medications: Current Outpatient Medications  Medication Sig Dispense Refill   aspirin 81 MG chewable tablet Chew by mouth.     cyclobenzaprine (FLEXERIL) 10 MG tablet Take 1 tablet (10 mg total) by mouth 2 (two) times daily as needed for muscle spasms. 20 tablet 0   glimepiride (AMARYL) 2 MG tablet Take 2 mg by mouth daily.     ibuprofen (ADVIL) 800 MG tablet Take 1 tablet (800 mg total) by mouth 3 (three) times daily. 30 tablet 0   losartan (COZAAR) 50 MG tablet losartan 50 mg tablet     Multiple Vitamins-Minerals (THERA-M) TABS Take by mouth.     Omega-3 Fatty Acids (FISH OIL) 1000 MG CAPS Take by mouth.      predniSONE (STERAPRED UNI-PAK 21 TAB) 10 MG (21) TBPK tablet Take 6 tabs by mouth daily  for 2 days, then 5 tabs for 2 days, then 4 tabs for 2 days, then 3 tabs for 2 days, 2 tabs for 2 days, then 1 tab by mouth daily for 2 days 42 tablet 0   simvastatin (ZOCOR) 10 MG tablet Take 10 mg by mouth daily.     No current facility-administered medications for this visit.      Subjective:   Patient presents for session.  Assessed recent events, progress.  Patient focused on the relationship with his daughter, she is to visit over the next 2 weeks for the Christmas break.  He stated that she spends most of her time on the visit with her mother primarily, this in part due to his not having space at his residence, his having a roommate.  Patient shared how he still feels a lack of effort on his daughter's part, how she spends maybe a day or 2 at most with him.  He stated that he also would like to see her dog as he has an attachment to her pet, however, she recently indicated to him that she did not feel comfortable with his watching the dog without her being present.  Patient identified not feeling trusted by his daughter  and we encouraged him to further discuss his feelings with her, careful not to make assumptions; he admitted feeling that she had a lack of trust in him, this leading him to feel frustrated.  Provide support and understanding throughout continue to work with patient from a strengths based cognitive behavioral framework.   Interventions:  supportive therapy, motivational interviewing, CBT   Diagnoses:  No diagnosis found.      Plan:  Patient is to utilize coping skills as discussed in session, utilize his support system, maintain his level of functioning by getting adequate rest, journal between sessions, continue to utilize his support system.  Long-term goals:   Maintain symptom reduction: The patient will report sustained reduction in symptoms of depression, irritability using  both CBT and mindfulness interventions. Improve emotional regulation: The patient will learn and apply CBT and mindfulness-based strategies to regulate emotions such as his tendency to get angry and report an improvement in emotional regulation for at least 3 consecutive months progressively.   Short-term goal:  The patient will learn and apply CBT and mindfulness-based coping skills for managing emotional distress and practice using it between sessions.       2.   The patient will CBT and mindfulness-based interventions to increase awareness of negative thought patterns and work to reframe them as needed.       3.   The patient will explore and identify cognitions that add to his feelings of unresolved anger and sadness           Waldron Session, [facility]

## 2022-08-20 ENCOUNTER — Ambulatory Visit (INDEPENDENT_AMBULATORY_CARE_PROVIDER_SITE_OTHER): Payer: 59 | Admitting: Mental Health

## 2022-08-20 DIAGNOSIS — F4320 Adjustment disorder, unspecified: Secondary | ICD-10-CM

## 2022-08-20 NOTE — Progress Notes (Signed)
[facility]  Name: Evan Reyes Date: 08/20/2022 MRN: [id number] DOB: [birth year] PCP: [facility], [place]  Time spent: 49 minutes  Treatment:   ind. therapy  Mental Status Exam:    Appearance:    Casual     Behavior:   Appropriate  Motor:   WNL  Speech/Language:    Clear and Coherent  Affect:   Full range   Mood:   Euthymic  Thought process:   Logical, linear, goal directed  Thought content:     WNL  Sensory/Perceptual disturbances:     none  Orientation:   x4  Attention:   Good  Concentration:   Good  Memory:   Intact  Fund of knowledge:    Consistent with age and development  Insight:     Good  Judgment:    Good  Impulse Control:   Good     Reported Symptoms:  some depressed mood, anger/irritability   Risk Assessment: Danger to Self:  No Self-injurious Behavior: No Danger to Others: No Duty to Warn:no Physical Aggression / Violence:No  Access to Firearms a concern: No  Gang Involvement:No  Patient / guardian was educated about steps to take if suicide or homicide risk level increases between visits: yes While future psychiatric events cannot be accurately predicted, the patient does not currently require acute inpatient psychiatric care and does not currently meet [facility] involuntary commitment criteria.   Medications: Current Outpatient Medications  Medication Sig Dispense Refill   aspirin 81 MG chewable tablet Chew by mouth.     cyclobenzaprine (FLEXERIL) 10 MG tablet Take 1 tablet (10 mg total) by mouth 2 (two) times daily as needed for muscle spasms. 20 tablet 0   glimepiride (AMARYL) 2 MG tablet Take 2 mg by mouth daily.     ibuprofen (ADVIL) 800 MG tablet Take 1 tablet (800 mg total) by mouth 3 (three) times daily. 30 tablet 0   losartan (COZAAR) 50 MG tablet losartan 50 mg tablet     Multiple Vitamins-Minerals (THERA-M) TABS Take by mouth.     Omega-3 Fatty Acids (FISH OIL) 1000 MG CAPS Take by mouth.      predniSONE (STERAPRED UNI-PAK 21 TAB) 10 MG (21) TBPK tablet Take 6 tabs by mouth daily  for 2 days, then 5 tabs for 2 days, then 4 tabs for 2 days, then 3 tabs for 2 days, 2 tabs for 2 days, then 1 tab by mouth daily for 2 days 42 tablet 0   simvastatin (ZOCOR) 10 MG tablet Take 10 mg by mouth daily.     No current facility-administered medications for this visit.      Subjective:   Patient presents for session in no distress.  Assessed recent events in progress.  Patient shared how he had a pleasant visit overall with his daughter.  Reviewed some content from previous session related to her visit and his also being able to spend some time with her dog with him he is very close.  Patient stated that he worked on reminding himself of focusing what he can control and letting go of what he cannot.  He stated this helped him deal with this situation better and went on to share experiences with his mother and sister with him he and his daughter visited over the break as well.  He went on to share concerns about his sister's daughter, ways he tries to be supportive while also recognizing that his sister is strong willed and has narcissistic tendencies  per patient.  Patient shared more history related to his sister, who is about 28 years older than he is, growing up in their home where she often had conflicts with their father.  He stated they were both very much alike however, stated that his sister had difficulty "letting things go" and feels this plays a role in some of the issues she may have with her current relationships, notably her daughter and husband.    Interventions:  supportive therapy, motivational interviewing, CBT   Diagnoses:    ICD-10-CM   1. Adjustment disorder, unspecified type  F43.20           Plan:  Patient is to utilize coping skills as discussed in session, utilize his support system, maintain his level of functioning by getting adequate rest, journal between sessions,  continue to utilize his support system.  Long-term goals:   Maintain symptom reduction: The patient will report sustained reduction in symptoms of depression, irritability using both CBT and mindfulness interventions. Improve emotional regulation: The patient will learn and apply CBT and mindfulness-based strategies to regulate emotions such as his tendency to get angry and report an improvement in emotional regulation for at least 3 consecutive months progressively.   Short-term goal:  The patient will learn and apply CBT and mindfulness-based coping skills for managing emotional distress and practice using it between sessions.       2.   The patient will CBT and mindfulness-based interventions to increase awareness of negative thought patterns and work to reframe them as needed.       3.   The patient will explore and identify cognitions that add to his feelings of unresolved anger and sadness           Anson [place], [facility]

## 2022-09-17 ENCOUNTER — Ambulatory Visit (INDEPENDENT_AMBULATORY_CARE_PROVIDER_SITE_OTHER): Payer: 59 | Admitting: Mental Health

## 2022-09-17 DIAGNOSIS — F4320 Adjustment disorder, unspecified: Secondary | ICD-10-CM | POA: Diagnosis not present

## 2022-09-17 NOTE — Progress Notes (Signed)
[facility]  Name: Evan Reyes Date: 09/17/2022 MRN: [id number] DOB: 1966/05/07 PCP: [facility], [place]  Time spent: 50 minutes  Treatment:   ind. therapy  Mental Status Exam:    Appearance:    Casual     Behavior:   Appropriate  Motor:   WNL  Speech/Language:    Clear and Coherent  Affect:   Full range   Mood:   Euthymic  Thought process:   Logical, linear, goal directed  Thought content:     WNL  Sensory/Perceptual disturbances:     none  Orientation:   x4  Attention:   Good  Concentration:   Good  Memory:   Intact  Fund of knowledge:    Consistent with age and development  Insight:     Good  Judgment:    Good  Impulse Control:   Good     Reported Symptoms:  some depressed mood, anger/irritability   Risk Assessment: Danger to Self:  No Self-injurious Behavior: No Danger to Others: No Duty to Warn:no Physical Aggression / Violence:No  Access to Firearms a concern: No  Gang Involvement:No  Patient / guardian was educated about steps to take if suicide or homicide risk level increases between visits: yes While future psychiatric events cannot be accurately predicted, the patient does not currently require acute inpatient psychiatric care and does not currently meet [facility] involuntary commitment criteria.   Medications: Current Outpatient Medications  Medication Sig Dispense Refill   aspirin 81 MG chewable tablet Chew by mouth.     cyclobenzaprine (FLEXERIL) 10 MG tablet Take 1 tablet (10 mg total) by mouth 2 (two) times daily as needed for muscle spasms. 20 tablet 0   glimepiride (AMARYL) 2 MG tablet Take 2 mg by mouth daily.     ibuprofen (ADVIL) 800 MG tablet Take 1 tablet (800 mg total) by mouth 3 (three) times daily. 30 tablet 0   losartan (COZAAR) 50 MG tablet losartan 50 mg tablet     Multiple Vitamins-Minerals (THERA-M) TABS Take by mouth.     Omega-3 Fatty Acids (FISH OIL) 1000 MG CAPS Take by mouth.      predniSONE (STERAPRED UNI-PAK 21 TAB) 10 MG (21) TBPK tablet Take 6 tabs by mouth daily  for 2 days, then 5 tabs for 2 days, then 4 tabs for 2 days, then 3 tabs for 2 days, 2 tabs for 2 days, then 1 tab by mouth daily for 2 days 42 tablet 0   simvastatin (ZOCOR) 10 MG tablet Take 10 mg by mouth daily.     No current facility-administered medications for this visit.      Subjective:   Patient presents for session in no distress.  Assessed recent progress.  Patient shared how he continues to cope with the loss of his marital relationship which ended about 3 years ago.  Facilitated his identifying thoughts and feelings related, his sharing how he does not hold onto any anger, rather he would like for he and his ex-wife to be able to communicate on a very basic level due for their daughter's sake.  Facilitated his identifying needs, provided support throughout.  Through further guided discovery, he identified and acknowledged how he may never fully understand why the relationship ended so abruptly and how his wife offered no explanation after leaving, and made no contact and has not since.  Further self insight gained throughout the significant life adjustment to this change was explored and facilitated patient identifying self supportive  thoughts to be mindful of as he continues to adjust.   Interventions:  supportive therapy, motivational interviewing, CBT   Diagnoses:    ICD-10-CM   1. Adjustment disorder, unspecified type  F43.20            Plan:  Patient is to utilize coping skills as discussed in session, utilize his support system, maintain his level of functioning by getting adequate rest, journal between sessions, continue to utilize his support system.  Long-term goals:   Maintain symptom reduction: The patient will report sustained reduction in symptoms of depression, irritability using both CBT and mindfulness interventions. Improve emotional regulation: The patient will learn  and apply CBT and mindfulness-based strategies to regulate emotions such as his tendency to get angry and report an improvement in emotional regulation for at least 3 consecutive months progressively.   Short-term goal:  The patient will learn and apply CBT and mindfulness-based coping skills for managing emotional distress and practice using it between sessions.       2.   The patient will CBT and mindfulness-based interventions to increase awareness of negative thought patterns and work to reframe them as needed.       3.   The patient will explore and identify cognitions that add to his feelings of unresolved anger and sadness           Anson [place], [facility]

## 2022-10-15 ENCOUNTER — Ambulatory Visit (INDEPENDENT_AMBULATORY_CARE_PROVIDER_SITE_OTHER): Payer: 59 | Admitting: Mental Health

## 2022-10-15 DIAGNOSIS — F4320 Adjustment disorder, unspecified: Secondary | ICD-10-CM | POA: Diagnosis not present

## 2022-10-18 NOTE — Progress Notes (Signed)
[facility]  Name: Evan Reyes Date: 10/15/22 MRN: [id number] DOB: 05-24-1966 PCP: [facility]  Time spent: 51 minutes  Treatment:   ind. therapy  Mental Status Exam:    Appearance:    Casual     Behavior:   Appropriate  Motor:   WNL  Speech/Language:    Clear and Coherent  Affect:   Full range   Mood:   Euthymic  Thought process:   Logical, linear, goal directed  Thought content:     WNL  Sensory/Perceptual disturbances:     none  Orientation:   x4  Attention:   Good  Concentration:   Good  Memory:   Intact  Fund of knowledge:    Consistent with age and development  Insight:     Good  Judgment:    Good  Impulse Control:   Good     Reported Symptoms:  some depressed mood, anger/irritability   Risk Assessment: Danger to Self:  No Self-injurious Behavior: No Danger to Others: No Duty to Warn:no Physical Aggression / Violence:No  Access to Firearms a concern: No  Gang Involvement:No  Patient / guardian was educated about steps to take if suicide or homicide risk level increases between visits: yes While future psychiatric events cannot be accurately predicted, the patient does not currently require acute inpatient psychiatric care and does not currently meet [facility] involuntary commitment criteria.   Medications: Current Outpatient Medications  Medication Sig Dispense Refill   aspirin 81 MG chewable tablet Chew by mouth.     cyclobenzaprine (FLEXERIL) 10 MG tablet Take 1 tablet (10 mg total) by mouth 2 (two) times daily as needed for muscle spasms. 20 tablet 0   glimepiride (AMARYL) 2 MG tablet Take 2 mg by mouth daily.     ibuprofen (ADVIL) 800 MG tablet Take 1 tablet (800 mg total) by mouth 3 (three) times daily. 30 tablet 0   losartan (COZAAR) 50 MG tablet losartan 50 mg tablet     Multiple Vitamins-Minerals (THERA-M) TABS Take by mouth.     Omega-3 Fatty Acids (FISH OIL) 1000 MG CAPS Take by mouth.      predniSONE (STERAPRED UNI-PAK 21 TAB) 10 MG (21) TBPK tablet Take 6 tabs by mouth daily  for 2 days, then 5 tabs for 2 days, then 4 tabs for 2 days, then 3 tabs for 2 days, 2 tabs for 2 days, then 1 tab by mouth daily for 2 days 42 tablet 0   simvastatin (ZOCOR) 10 MG tablet Take 10 mg by mouth daily.     No current facility-administered medications for this visit.      Subjective:   Patient presents for session in no distress.  Assessed recent events in progress.  Patient continues to identify and process thoughts and feelings related to ongoing life adjustments related to his marital separation, past significant family events and work.  He continues to work as a Administrator, sharing his history of being away from his daughter often when she was growing up.  He stated that he would often be gone for a month to month and a half throughout her childhood, this being difficult but also needed as he identified how he was focused on being a provider for the family for those many years.  Through further guided discovery, he had continues to identify the unmet need of being able to process feelings related to his marital separation due to his wife leaving abruptly, not communicating with him  in any manner and has not up to present day.  Worked with patient to reframe thoughts associated with a focus on what he feels at this point he can effectively control which is his own interpretation of these events and how it affects him emotionally.  He identified feeling that he has made progress in working past some of the hurt and anger associated at that time. He continues to evaluate the relationship he has with his girlfriend which he identified feeling more understood and accepted.  Interventions:  supportive therapy, motivational interviewing, CBT   Diagnoses:    ICD-10-CM   1. Adjustment disorder, unspecified type  F43.20             Plan:  Patient is to utilize coping skills as discussed in  session, utilize his support system, maintain his level of functioning by getting adequate rest, journal between sessions, continue to utilize his support system.  Long-term goals:   Maintain symptom reduction: The patient will report sustained reduction in symptoms of depression, irritability using both CBT and mindfulness interventions. Improve emotional regulation: The patient will learn and apply CBT and mindfulness-based strategies to regulate emotions such as his tendency to get angry and report an improvement in emotional regulation for at least 3 consecutive months progressively.   Short-term goal:  The patient will learn and apply CBT and mindfulness-based coping skills for managing emotional distress and practice using it between sessions.       2.   The patient will CBT and mindfulness-based interventions to increase awareness of negative thought patterns and work to reframe them as needed.       3.   The patient will explore and identify cognitions that add to his feelings of unresolved anger and sadness           Anson [place], [facility]

## 2022-11-12 ENCOUNTER — Ambulatory Visit (INDEPENDENT_AMBULATORY_CARE_PROVIDER_SITE_OTHER): Payer: 59 | Admitting: Mental Health

## 2022-11-12 DIAGNOSIS — F4320 Adjustment disorder, unspecified: Secondary | ICD-10-CM

## 2022-11-12 NOTE — Progress Notes (Signed)
[facility]  Name: Evan Reyes Date: 11/12/22 MRN: [id number] DOB: [birth year] PCP: [facility]  Time spent: 50 minutes  Treatment:   ind. therapy  Mental Status Exam:    Appearance:    Casual     Behavior:   Appropriate  Motor:   WNL  Speech/Language:    Clear and Coherent  Affect:   Full range   Mood:   Euthymic  Thought process:   Logical, linear, goal directed  Thought content:     WNL  Sensory/Perceptual disturbances:     none  Orientation:   x4  Attention:   Good  Concentration:   Good  Memory:   Intact  Fund of knowledge:    Consistent with age and development  Insight:     Good  Judgment:    Good  Impulse Control:   Good     Reported Symptoms:  some depressed mood, anger/irritability   Risk Assessment: Danger to Self:  No Self-injurious Behavior: No Danger to Others: No Duty to Warn:no Physical Aggression / Violence:No  Access to Firearms a concern: No  Gang Involvement:No  Patient / guardian was educated about steps to take if suicide or homicide risk level increases between visits: yes While future psychiatric events cannot be accurately predicted, the patient does not currently require acute inpatient psychiatric care and does not currently meet [facility] involuntary commitment criteria.   Medications: Current Outpatient Medications  Medication Sig Dispense Refill   aspirin 81 MG chewable tablet Chew by mouth.     cyclobenzaprine (FLEXERIL) 10 MG tablet Take 1 tablet (10 mg total) by mouth 2 (two) times daily as needed for muscle spasms. 20 tablet 0   glimepiride (AMARYL) 2 MG tablet Take 2 mg by mouth daily.     ibuprofen (ADVIL) 800 MG tablet Take 1 tablet (800 mg total) by mouth 3 (three) times daily. 30 tablet 0   losartan (COZAAR) 50 MG tablet losartan 50 mg tablet     Multiple Vitamins-Minerals (THERA-M) TABS Take by mouth.     Omega-3 Fatty Acids (FISH OIL) 1000 MG CAPS Take by mouth.      predniSONE (STERAPRED UNI-PAK 21 TAB) 10 MG (21) TBPK tablet Take 6 tabs by mouth daily  for 2 days, then 5 tabs for 2 days, then 4 tabs for 2 days, then 3 tabs for 2 days, 2 tabs for 2 days, then 1 tab by mouth daily for 2 days 42 tablet 0   simvastatin (ZOCOR) 10 MG tablet Take 10 mg by mouth daily.     No current facility-administered medications for this visit.      Subjective:   Patient presents for session.  Assessed recent events where patient stated he continues to want to have communication with his ex-wife only on behalf of their adult daughter.  He stated that he knows at some point, there will be events later in their daughter's life where both of them may need to be present.  He would rather he and his ex-wife have a civil communication with this in mind as they have had no communication since the separation a few years ago.  Through guided discovery, he identified at this point he finds himself not wonder why she left more that he is just focused on wanting to have some basic level of communication for their daughter's sake.  Facilitated his identifying what he feels he can control at this point where he identified the relationship with his daughter  as his primary focus.  He continues to have support from his girlfriend.   Interventions:  supportive therapy, motivational interviewing, CBT   Diagnoses:    ICD-10-CM   1. Adjustment disorder, unspecified type  F43.20              Plan:  Patient is to utilize coping skills as discussed in session, utilize his support system, maintain his level of functioning by getting adequate rest, journal between sessions, continue to utilize his support system.  Long-term goals:   Maintain symptom reduction: The patient will report sustained reduction in symptoms of depression, irritability using both CBT and mindfulness interventions. Improve emotional regulation: The patient will learn and apply CBT and mindfulness-based strategies to  regulate emotions such as his tendency to get angry and report an improvement in emotional regulation for at least 3 consecutive months progressively.   Short-term goal:  The patient will learn and apply CBT and mindfulness-based coping skills for managing emotional distress and practice using it between sessions.       2.   The patient will CBT and mindfulness-based interventions to increase awareness of negative thought patterns and work to reframe them as needed.       3.   The patient will explore and identify cognitions that add to his feelings of unresolved anger and sadness           Waldron Session, [facility]

## 2022-12-17 ENCOUNTER — Ambulatory Visit (INDEPENDENT_AMBULATORY_CARE_PROVIDER_SITE_OTHER): Payer: 59 | Admitting: Mental Health

## 2022-12-17 DIAGNOSIS — F4320 Adjustment disorder, unspecified: Secondary | ICD-10-CM

## 2022-12-17 NOTE — Progress Notes (Signed)
[facility] note  Name: Evan Reyes Date:  12/17/22 MRN: [id number] DOB: 04-18-1966 PCP: [facility]  Time spent: 49 minutes  Treatment:   ind. therapy  Mental Status Exam:    Appearance:    Casual     Behavior:   Appropriate  Motor:   WNL  Speech/Language:    Clear and Coherent  Affect:   Full range   Mood:   Euthymic  Thought process:   Logical, linear, goal directed  Thought content:     WNL  Sensory/Perceptual disturbances:     none  Orientation:   x4  Attention:   Good  Concentration:   Good  Memory:   Intact  Fund of knowledge:    Consistent with age and development  Insight:     Good  Judgment:    Good  Impulse Control:   Good     Reported Symptoms:  some depressed mood, anger/irritability   Risk Assessment: Danger to Self:  No Self-injurious Behavior: No Danger to Others: No Duty to Warn:no Physical Aggression / Violence:No  Access to Firearms a concern: No  Gang Involvement:No  Patient / guardian was educated about steps to take if suicide or homicide risk level increases between visits: yes While future psychiatric events cannot be accurately predicted, the patient does not currently require acute inpatient psychiatric care and does not currently meet [facility] involuntary commitment criteria.   Medications: Current Outpatient Medications  Medication Sig Dispense Refill   aspirin 81 MG chewable tablet Chew by mouth.     cyclobenzaprine (FLEXERIL) 10 MG tablet Take 1 tablet (10 mg total) by mouth 2 (two) times daily as needed for muscle spasms. 20 tablet 0   glimepiride (AMARYL) 2 MG tablet Take 2 mg by mouth daily.     ibuprofen (ADVIL) 800 MG tablet Take 1 tablet (800 mg total) by mouth 3 (three) times daily. 30 tablet 0   losartan (COZAAR) 50 MG tablet losartan 50 mg tablet     Multiple Vitamins-Minerals (THERA-M) TABS Take by mouth.     Omega-3 Fatty Acids (FISH OIL) 1000 MG CAPS Take by mouth.      predniSONE (STERAPRED UNI-PAK 21 TAB) 10 MG (21) TBPK tablet Take 6 tabs by mouth daily  for 2 days, then 5 tabs for 2 days, then 4 tabs for 2 days, then 3 tabs for 2 days, 2 tabs for 2 days, then 1 tab by mouth daily for 2 days 42 tablet 0   simvastatin (ZOCOR) 10 MG tablet Take 10 mg by mouth daily.     No current facility-administered medications for this visit.      Subjective:   Patient presents for session.  Assessed progress where patient continues to cope with unresolved feelings related to the separation from his ex-wife.  He stated that in many ways he has moved on from the relationship of 30 years however, he continues to question at times why she has had no contact with him as he needs a sense of closure.  He stated that his only intention is to have a level of communication with his ex-wife in the event they are around each other on behalf of their daughter such as her graduation or other life events.  He stated that his focus is his daughter and he wants to have this level of cordial communication to support her.  Provide support and understanding throughout, continued to work with patient via motivational interviewing toward identifying what he feels  he needs to take steps toward as a way to cope and care for himself regardless if the communication occurs.  He identified how he refocuses his mind back on the reality of the situation, separating logical and rational thoughts versus ones that are emotional.   Interventions:  supportive therapy, motivational interviewing, CBT   Diagnoses:    ICD-10-CM   1. Adjustment disorder, unspecified type  F43.20         Plan:  Patient is to utilize coping skills as discussed in session, utilize his support system, maintain his level of functioning by getting adequate rest, journal between sessions, continue to utilize his support system.  Long-term goals:   Maintain symptom reduction: The patient will report sustained reduction in  symptoms of depression, irritability using both CBT and mindfulness interventions. Improve emotional regulation: The patient will learn and apply CBT and mindfulness-based strategies to regulate emotions such as his tendency to get angry and report an improvement in emotional regulation for at least 3 consecutive months progressively.   Short-term goal:  The patient will learn and apply CBT and mindfulness-based coping skills for managing emotional distress and practice using it between sessions.       2.   The patient will CBT and mindfulness-based interventions to increase awareness of negative thought patterns and work to reframe them as needed.       3.   The patient will explore and identify cognitions that add to his feelings of unresolved anger and sadness           Waldron Session, [facility]

## 2023-01-07 ENCOUNTER — Ambulatory Visit (INDEPENDENT_AMBULATORY_CARE_PROVIDER_SITE_OTHER): Payer: 59 | Admitting: Mental Health

## 2023-01-07 DIAGNOSIS — F4320 Adjustment disorder, unspecified: Secondary | ICD-10-CM

## 2023-01-07 NOTE — Progress Notes (Signed)
[facility]  Name: Evan Reyes Date:  01/07/23 MRN: [id number] DOB: 11-07-1965 PCP: [facility]  Time spent: 50 minutes  Treatment:   ind. therapy  Mental Status Exam:    Appearance:    Casual     Behavior:   Appropriate  Motor:   WNL  Speech/Language:    Clear and Coherent  Affect:   Full range   Mood:   Euthymic  Thought process:   Logical, linear, goal directed  Thought content:     WNL  Sensory/Perceptual disturbances:     none  Orientation:   x4  Attention:   Good  Concentration:   Good  Memory:   Intact  Fund of knowledge:    Consistent with age and development  Insight:     Good  Judgment:    Good  Impulse Control:   Good     Reported Symptoms:  some depressed mood, anger/irritability   Risk Assessment: Danger to Self:  No Self-injurious Behavior: No Danger to Others: No Duty to Warn:no Physical Aggression / Violence:No  Access to Firearms a concern: No  Gang Involvement:No  Patient / guardian was educated about steps to take if suicide or homicide risk level increases between visits: yes While future psychiatric events cannot be accurately predicted, the patient does not currently require acute inpatient psychiatric care and does not currently meet [facility] involuntary commitment criteria.   Medications: Current Outpatient Medications  Medication Sig Dispense Refill   aspirin 81 MG chewable tablet Chew by mouth.     cyclobenzaprine (FLEXERIL) 10 MG tablet Take 1 tablet (10 mg total) by mouth 2 (two) times daily as needed for muscle spasms. 20 tablet 0   glimepiride (AMARYL) 2 MG tablet Take 2 mg by mouth daily.     ibuprofen (ADVIL) 800 MG tablet Take 1 tablet (800 mg total) by mouth 3 (three) times daily. 30 tablet 0   losartan (COZAAR) 50 MG tablet losartan 50 mg tablet     Multiple Vitamins-Minerals (THERA-M) TABS Take by mouth.     Omega-3 Fatty Acids (FISH OIL) 1000 MG CAPS Take by mouth.      predniSONE (STERAPRED UNI-PAK 21 TAB) 10 MG (21) TBPK tablet Take 6 tabs by mouth daily  for 2 days, then 5 tabs for 2 days, then 4 tabs for 2 days, then 3 tabs for 2 days, 2 tabs for 2 days, then 1 tab by mouth daily for 2 days 42 tablet 0   simvastatin (ZOCOR) 10 MG tablet Take 10 mg by mouth daily.     No current facility-administered medications for this visit.      Subjective:   Patient presents for session.  Assessed progress where patient shared that he feels he is making progress with coping with feelings related to his ongoing adjustment following his wife leaving him a few years ago.  He shared how he has been himself less occupied in his mind about the past, her leaving abruptly without any communication after.  He shared how he and his girlfriend continue to communicate as needed about this issue from time to time.  He stated that he continues to focus on the relationship with his daughter, which he states continues to go very well.  He stated that he knows that she is close to her mo feels he has let sther and wants her to have a good relationship with her, however, at this point he identified not finding any within himself to  have any communication with her to achieve his own closure.  Continue to work with patient from a cognitive behavioral framework, identifying emotional thinking versus logical thinking where he stated he is focused more on the rationalization of the situation andome of the hurt and pain from that time go.     Interventions:  supportive therapy, motivational interviewing, CBT   Diagnoses:    ICD-10-CM   1. Adjustment disorder, unspecified type  F43.20          Plan:  Patient is to utilize coping skills as discussed in session, utilize his support system, maintain his level of functioning by getting adequate rest, journal between sessions, continue to utilize his support system.  Long-term goals:   Maintain symptom reduction: The patient will report  sustained reduction in symptoms of depression, irritability using both CBT and mindfulness interventions. Improve emotional regulation: The patient will learn and apply CBT and mindfulness-based strategies to regulate emotions such as his tendency to get angry and report an improvement in emotional regulation for at least 3 consecutive months progressively.   Short-term goal:  The patient will learn and apply CBT and mindfulness-based coping skills for managing emotional distress and practice using it between sessions.       2.   The patient will CBT and mindfulness-based interventions to increase awareness of negative thought patterns and work to reframe them as needed.       3.   The patient will explore and identify cognitions that add to his feelings of unresolved anger and sadness           Waldron Session, [facility]

## 2023-01-21 ENCOUNTER — Ambulatory Visit: Payer: 59 | Admitting: Mental Health

## 2023-02-04 ENCOUNTER — Ambulatory Visit (INDEPENDENT_AMBULATORY_CARE_PROVIDER_SITE_OTHER): Payer: 59 | Admitting: Mental Health

## 2023-02-04 DIAGNOSIS — F4320 Adjustment disorder, unspecified: Secondary | ICD-10-CM

## 2023-02-04 NOTE — Progress Notes (Signed)
[facility]  Name: Evan Reyes Date:  02/04/23 MRN: [id number] DOB: 13-Oct-1965 PCP: [facility]  Time spent: 49 minutes  Treatment:   ind. therapy  Mental Status Exam:    Appearance:    Casual     Behavior:   Appropriate  Motor:   WNL  Speech/Language:    Clear and Coherent  Affect:   Full range   Mood:   Euthymic  Thought process:   Logical, linear, goal directed  Thought content:     WNL  Sensory/Perceptual disturbances:     none  Orientation:   x4  Attention:   Good  Concentration:   Good  Memory:   Intact  Fund of knowledge:    Consistent with age and development  Insight:     Good  Judgment:    Good  Impulse Control:   Good     Reported Symptoms:  some depressed mood, anger/irritability   Risk Assessment: Danger to Self:  No Self-injurious Behavior: No Danger to Others: No Duty to Warn:no Physical Aggression / Violence:No  Access to Firearms a concern: No  Gang Involvement:No  Patient / guardian was educated about steps to take if suicide or homicide risk level increases between visits: yes While future psychiatric events cannot be accurately predicted, the patient does not currently require acute inpatient psychiatric care and does not currently meet [facility] involuntary commitment criteria.   Medications: Current Outpatient Medications  Medication Sig Dispense Refill   aspirin 81 MG chewable tablet Chew by mouth.     cyclobenzaprine (FLEXERIL) 10 MG tablet Take 1 tablet (10 mg total) by mouth 2 (two) times daily as needed for muscle spasms. 20 tablet 0   glimepiride (AMARYL) 2 MG tablet Take 2 mg by mouth daily.     ibuprofen (ADVIL) 800 MG tablet Take 1 tablet (800 mg total) by mouth 3 (three) times daily. 30 tablet 0   losartan (COZAAR) 50 MG tablet losartan 50 mg tablet     Multiple Vitamins-Minerals (THERA-M) TABS Take by mouth.     Omega-3 Fatty Acids (FISH OIL) 1000 MG CAPS Take by mouth.      predniSONE (STERAPRED UNI-PAK 21 TAB) 10 MG (21) TBPK tablet Take 6 tabs by mouth daily  for 2 days, then 5 tabs for 2 days, then 4 tabs for 2 days, then 3 tabs for 2 days, 2 tabs for 2 days, then 1 tab by mouth daily for 2 days 42 tablet 0   simvastatin (ZOCOR) 10 MG tablet Take 10 mg by mouth daily.     No current facility-administered medications for this visit.      Subjective:   Patient presents for session.  Patient shared recent events in progress.  He stated that he is considering looking at old family photo albums but has not done so thus far due to his questioning if he is really ready.  Explored with patient more details where he stated that he has looked at the family for developments for the past 3 years after symptoms his wife left him.  He acknowledged the potential benefits of looking at the albums at some point and we explored ways that this could be beneficial emotionally and to continue to assist him in the healing process.  He feels he has made considerable progress adjusting to his marriage ending.  He went on to share more details related to communication with his wife to the point where she eventually left, her having no  contact with him whatsoever after leaving.  He continues to express wanting to be able to have a civil, cordial relationship to benefit their daughter going forward.  He stated that he continues to have no indication from his ex-wife that this is something she is open to.  He plans to continue to consider when he will look at the photo albums and acknowledged this being part of gaining more self acceptance.      Interventions:  supportive therapy, motivational interviewing, CBT   Diagnoses:    ICD-10-CM   1. Adjustment disorder, unspecified type  F43.20           Plan:  Patient is to utilize coping skills as discussed in session, utilize his support system, maintain his level of functioning by getting adequate rest, journal between sessions,  continue to utilize his support system.  Long-term goals:   Maintain symptom reduction: The patient will report sustained reduction in symptoms of depression, irritability using both CBT and mindfulness interventions. Improve emotional regulation: The patient will learn and apply CBT and mindfulness-based strategies to regulate emotions such as his tendency to get angry and report an improvement in emotional regulation for at least 3 consecutive months progressively.   Short-term goal:  The patient will learn and apply CBT and mindfulness-based coping skills for managing emotional distress and practice using it between sessions.       2.   The patient will CBT and mindfulness-based interventions to increase awareness of negative thought patterns and work to reframe them as needed.       3.   The patient will explore and identify cognitions that add to his feelings of unresolved anger and sadness           Waldron Session, [facility]

## 2023-02-06 IMAGING — DX DG KNEE 3 VIEWS*R*
3 series · 3 of 3 positions shown · non-contrast
Comparison: None.

CLINICAL DATA: Pain

EXAM:
RIGHT KNEE - 3 VIEW

[knee ap]
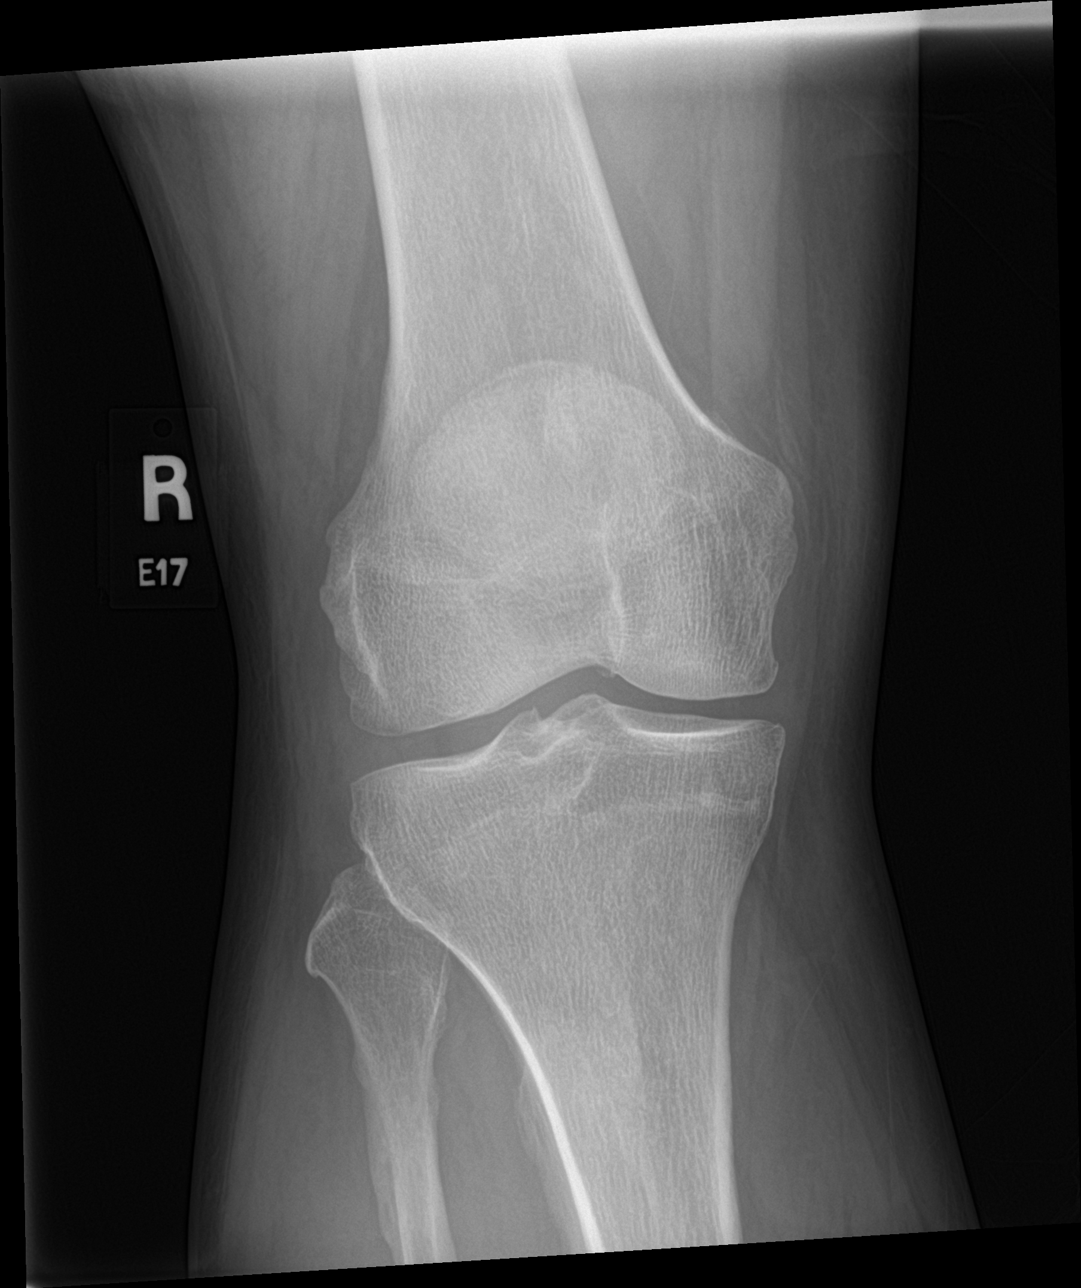

[knee lat]
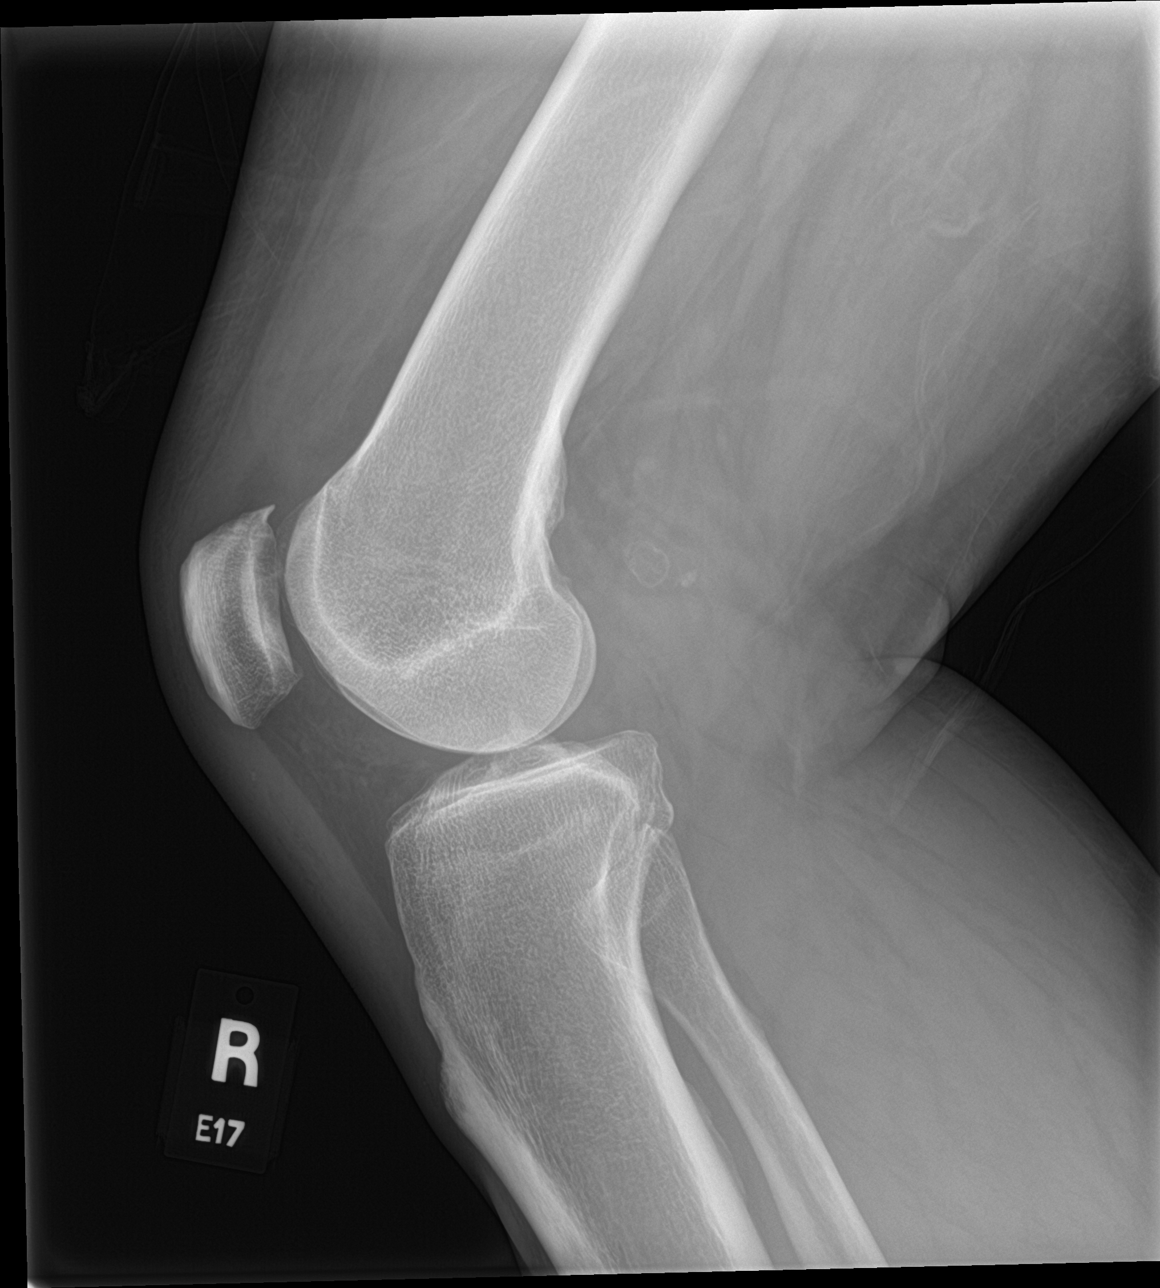

[knee sunrise]
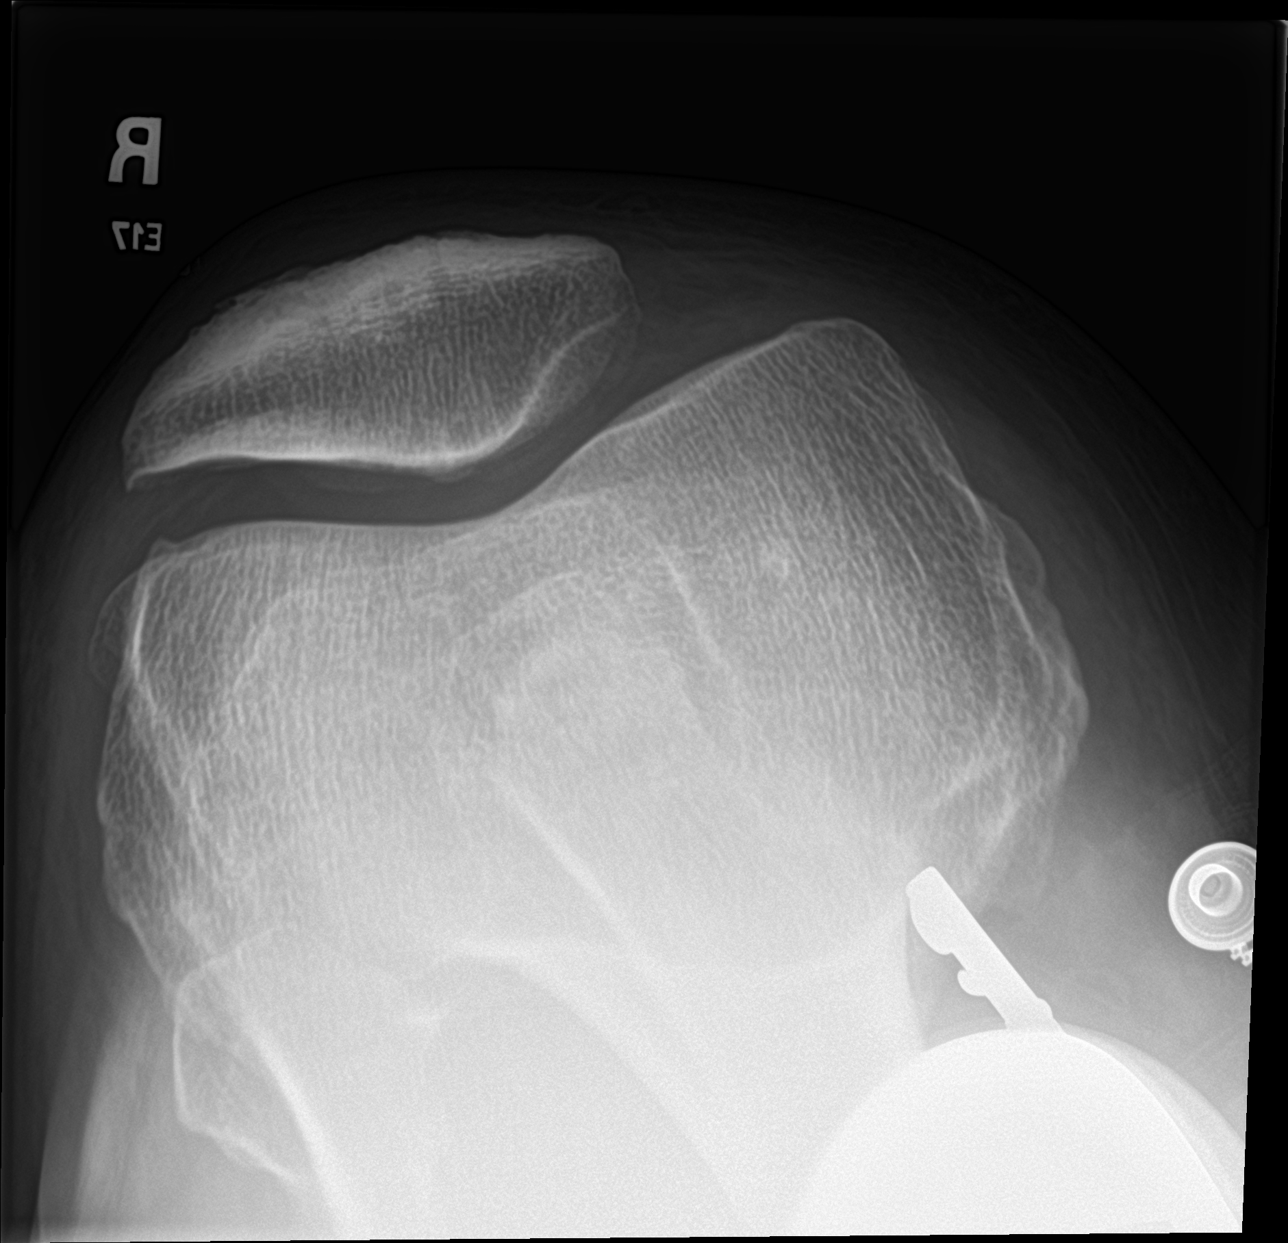

[3 of 3 positions shown; findings below may reference images not displayed]

FINDINGS: No recent fracture or dislocation is seen. Degenerative changes are
noted with small bony spurs in medial and patellofemoral
compartments. There is soft tissue fullness in the suprapatellar
bursa, possibly suggesting small effusion. There is faint 11 mm
ring-like calcific density in the popliteal fossa. There is another
smaller dense calcification in the popliteal fossa.
IMPRESSION: No recent fracture or dislocation is seen. Degenerative changes are
noted with bony spurs, more so in the patellofemoral compartment.
Possible small effusion is seen in the suprapatellar bursa. Calcific
densities in the popliteal fossa suggests possible loose bodies in
popliteal cyst.

## 2023-03-04 ENCOUNTER — Ambulatory Visit (INDEPENDENT_AMBULATORY_CARE_PROVIDER_SITE_OTHER): Payer: 59 | Admitting: Mental Health

## 2023-03-04 DIAGNOSIS — F4320 Adjustment disorder, unspecified: Secondary | ICD-10-CM

## 2023-03-04 NOTE — Progress Notes (Signed)
[facility]  Name: Evan Reyes Date:  03/04/23 MRN: [id number] DOB: 02-02-1966 PCP: [facility]  Time spent: 51 minutes  Treatment:   ind. therapy  Mental Status Exam:    Appearance:    Casual     Behavior:   Appropriate  Motor:   WNL  Speech/Language:    Clear and Coherent  Affect:   Full range   Mood:   Euthymic  Thought process:   Logical, linear, goal directed  Thought content:     WNL  Sensory/Perceptual disturbances:     none  Orientation:   x4  Attention:   Good  Concentration:   Good  Memory:   Intact  Fund of knowledge:    Consistent with age and development  Insight:     Good  Judgment:    Good  Impulse Control:   Good     Reported Symptoms:  some depressed mood, anger/irritability   Risk Assessment: Danger to Self:  No Self-injurious Behavior: No Danger to Others: No Duty to Warn:no Physical Aggression / Violence:No  Access to Firearms a concern: No  Gang Involvement:No  Patient / guardian was educated about steps to take if suicide or homicide risk level increases between visits: yes While future psychiatric events cannot be accurately predicted, the patient does not currently require acute inpatient psychiatric care and does not currently meet [facility] involuntary commitment criteria.   Medications: Current Outpatient Medications  Medication Sig Dispense Refill   aspirin 81 MG chewable tablet Chew by mouth.     cyclobenzaprine (FLEXERIL) 10 MG tablet Take 1 tablet (10 mg total) by mouth 2 (two) times daily as needed for muscle spasms. 20 tablet 0   glimepiride (AMARYL) 2 MG tablet Take 2 mg by mouth daily.     ibuprofen (ADVIL) 800 MG tablet Take 1 tablet (800 mg total) by mouth 3 (three) times daily. 30 tablet 0   losartan (COZAAR) 50 MG tablet losartan 50 mg tablet     Multiple Vitamins-Minerals (THERA-M) TABS Take by mouth.     Omega-3 Fatty Acids (FISH OIL) 1000 MG CAPS Take by mouth.      predniSONE (STERAPRED UNI-PAK 21 TAB) 10 MG (21) TBPK tablet Take 6 tabs by mouth daily  for 2 days, then 5 tabs for 2 days, then 4 tabs for 2 days, then 3 tabs for 2 days, 2 tabs for 2 days, then 1 tab by mouth daily for 2 days 42 tablet 0   simvastatin (ZOCOR) 10 MG tablet Take 10 mg by mouth daily.     No current facility-administered medications for this visit.      Subjective:   Patient presents for session on time.  Assessed events since last visit.  Patient primarily focused on work related stress.  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  His attempts to communicate, while professional were largely minimized or ignored.  Patient shared how he plans to handle his communication particularly when they lose this customer which he predicts will be within 3 months.  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  Ways to communicate effectively, professionally while managing understandable frustration was explored collaboratively.     Interventions:  supportive therapy, motivational interviewing, CBT   Diagnoses:    ICD-10-CM   1. Adjustment disorder,  unspecified type  F43.20            Plan:  Patient is to utilize coping skills as discussed in session, utilize his support system, maintain his level of functioning by getting adequate rest, journal between sessions, continue to utilize his support system.  Long-term goals:   Maintain symptom reduction: The patient will report sustained reduction in symptoms of depression, irritability using both CBT and mindfulness interventions. Improve emotional regulation: The patient will learn and apply CBT and mindfulness-based strategies to regulate emotions such as his tendency to get angry and report an improvement in emotional regulation  for at least 3 consecutive months progressively.   Short-term goal:  The patient will learn and apply CBT and mindfulness-based coping skills for managing emotional distress and practice using it between sessions.       2.   The patient will CBT and mindfulness-based interventions to increase awareness of negative thought patterns and work to reframe them as needed.       3.   The patient will explore and identify cognitions that add to his feelings of unresolved anger and sadness           Waldron Session, [facility]

## 2023-04-08 ENCOUNTER — Ambulatory Visit (INDEPENDENT_AMBULATORY_CARE_PROVIDER_SITE_OTHER): Payer: 59 | Admitting: Mental Health

## 2023-04-08 DIAGNOSIS — F4320 Adjustment disorder, unspecified: Secondary | ICD-10-CM

## 2023-04-08 NOTE — Progress Notes (Signed)
[facility] note  Name: Evan Reyes Date:  04/08/23 MRN: [id number] DOB: 08-05-1965 P[facility]: [facility]  Time spent: 50 minutes  Treatment:   ind. therapy  Mental Status Exam:    Appearance:    Casual     Behavior:   Appropriate  Motor:   WNL  Speech/Language:    Clear and Coherent  Affect:   Full range   Mood:   Euthymic  Thought process:   Logical, linear, goal directed  Thought content:     WNL  Sensory/Perceptual disturbances:     none  Orientation:   x4  Attention:   Good  Concentration:   Good  Memory:   Intact  Fund of knowledge:    Consistent with age and development  Insight:     Good  Judgment:    Good  Impulse Control:   Good     Reported Symptoms:  some depressed mood, anger/irritability   Risk Assessment: Danger to Self:  No Self-injurious Behavior: No Danger to Others: No Duty to Warn:no Physical Aggression / Violence:No  Access to Firearms a concern: No  Gang Involvement:No  Patient / guardian was educated about steps to take if suicide or homicide risk level increases between visits: yes While future psychiatric events cannot be accurately predicted, the patient does not currently require acute inpatient psychiatric care and does not currently meet [facility] involuntary commitment criteria.   Medications: Current Outpatient Medications  Medication Sig Dispense Refill   aspirin 81 MG chewable tablet Chew by mouth.     cyclobenzaprine (FLEXERIL) 10 MG tablet Take 1 tablet (10 mg total) by mouth 2 (two) times daily as needed for muscle spasms. 20 tablet 0   glimepiride (AMARYL) 2 MG tablet Take 2 mg by mouth daily.     ibuprofen (ADVIL) 800 MG tablet Take 1 tablet (800 mg total) by mouth 3 (three) times daily. 30 tablet 0   losartan (COZAAR) 50 MG tablet losartan 50 mg tablet     Multiple Vitamins-Minerals (THERA-M) TABS Take by mouth.     Omega-3 Fatty Acids (FISH OIL) 1000 MG CAPS Take by mouth.      predniSONE (STERAPRED UNI-PAK 21 TAB) 10 MG (21) TBPK tablet Take 6 tabs by mouth daily  for 2 days, then 5 tabs for 2 days, then 4 tabs for 2 days, then 3 tabs for 2 days, 2 tabs for 2 days, then 1 tab by mouth daily for 2 days 42 tablet 0   simvastatin (ZOCOR) 10 MG tablet Take 10 mg by mouth daily.     No current facility-administered medications for this visit.      Subjective:   Patient presents for session on time.  Patient shared progress.  He stated that he continues to cope with stressors at work and interpersonal.  He shared how he looks forward to taking some time off work as he feels he needs that at this point.  He stated that about a week ago, he got upset after dropping some food and then proceeded to throw it.  He stated that his girlfriend is very understanding and supportive.  Facilitated his identifying stressors he feels are associated with his outburst where he identified work and continuing to deal with the life adjustment of his marriage ending and how his wife never gave him any closure as she abruptly cut off communication. He identified the need to gain further insight into how he can be a better person, partner and how  not having a communication with his ex-wife about this, leaves him unsure.  Encouraged him to identify what he has learned about himself between sessions regardless of having this conversation as he also identified how he is not hopeful of what ever take place.   Interventions:  supportive therapy, motivational interviewing, CBT   Diagnoses:    ICD-10-CM   1. Adjustment disorder, unspecified type  F43.20          Plan:  Patient is to utilize coping skills as discussed in session, utilize his support system, maintain his level of functioning by getting adequate rest, journal between sessions, continue to utilize his support system.  Long-term goals:   Maintain symptom reduction: The patient will report sustained reduction in symptoms of  depression, irritability using both CBT and mindfulness interventions. Improve emotional regulation: The patient will learn and apply CBT and mindfulness-based strategies to regulate emotions such as his tendency to get angry and report an improvement in emotional regulation for at least 3 consecutive months progressively.   Short-term goal:  The patient will learn and apply CBT and mindfulness-based coping skills for managing emotional distress and practice using it between sessions.       2.   The patient will CBT and mindfulness-based interventions to increase awareness of negative thought patterns and work to reframe them as needed.       3.   The patient will explore and identify cognitions that add to his feelings of unresolved anger and sadness           Waldron Session, [facility]

## 2023-05-13 ENCOUNTER — Ambulatory Visit (INDEPENDENT_AMBULATORY_CARE_PROVIDER_SITE_OTHER): Payer: 59 | Admitting: Mental Health

## 2023-05-13 DIAGNOSIS — F4320 Adjustment disorder, unspecified: Secondary | ICD-10-CM | POA: Diagnosis not present

## 2023-05-13 NOTE — Progress Notes (Signed)
[facility] note  Name: Evan Reyes Date: 05/13/23 MRN: [id number] DOB: 1966/04/16 PCP: [facility]  Time spent: 45 minutes  Treatment:   ind. therapy  Mental Status Exam:    Appearance:    Casual     Behavior:   Appropriate  Motor:   WNL  Speech/Language:    Clear and Coherent  Affect:   Full range   Mood:   Euthymic  Thought process:   Logical, linear, goal directed  Thought content:     WNL  Sensory/Perceptual disturbances:     none  Orientation:   x4  Attention:   Good  Concentration:   Good  Memory:   Intact  Fund of knowledge:    Consistent with age and development  Insight:     Good  Judgment:    Good  Impulse Control:   Good     Reported Symptoms:  some depressed mood, anger/irritability   Risk Assessment: Danger to Self:  No Self-injurious Behavior: No Danger to Others: No Duty to Warn:no Physical Aggression / Violence:No  Access to Firearms a concern: No  Gang Involvement:No  Patient / guardian was educated about steps to take if suicide or homicide risk level increases between visits: yes While future psychiatric events cannot be accurately predicted, the patient does not currently require acute inpatient psychiatric care and does not currently meet [facility] involuntary commitment criteria.   Medications: Current Outpatient Medications  Medication Sig Dispense Refill   aspirin 81 MG chewable tablet Chew by mouth.     cyclobenzaprine (FLEXERIL) 10 MG tablet Take 1 tablet (10 mg total) by mouth 2 (two) times daily as needed for muscle spasms. 20 tablet 0   glimepiride (AMARYL) 2 MG tablet Take 2 mg by mouth daily.     ibuprofen (ADVIL) 800 MG tablet Take 1 tablet (800 mg total) by mouth 3 (three) times daily. 30 tablet 0   losartan (COZAAR) 50 MG tablet losartan 50 mg tablet     Multiple Vitamins-Minerals (THERA-M) TABS Take by mouth.     Omega-3 Fatty Acids (FISH OIL) 1000 MG CAPS Take by mouth.      predniSONE (STERAPRED UNI-PAK 21 TAB) 10 MG (21) TBPK tablet Take 6 tabs by mouth daily  for 2 days, then 5 tabs for 2 days, then 4 tabs for 2 days, then 3 tabs for 2 days, 2 tabs for 2 days, then 1 tab by mouth daily for 2 days 42 tablet 0   simvastatin (ZOCOR) 10 MG tablet Take 10 mg by mouth daily.     No current facility-administered medications for this visit.      Subjective:   Patient presents for session.  Assessed progress where he shared recent interactions with his mother.  He stated that he talks to her on the phone every few days going on to share their relational dynamic.  He stated that he is less close with his sister, only seeing her on special occasions which is typically about 2-3 times per year.  He went on to share differences he has with them, going on to share some aspects of his lifestyle and speculates how they would accept aspects of his daughter, who also has a more liberal lifestyle versus their conservative lifestyle.  He shared how his sister and father have some similar to personality traits going on to share some details, this also being partial reason why they have had conflict in their relationship in the past.  Through further  guided discovery, he identified an increased sense of contentment in his lifestyle choices that was not a part of his marriage as he felt that his wife would not accept these aspects of himself.  How this has affected his life profoundly was identified, feeling shared.  How these insights affect his sense of self acceptance and how this relates his mood at times was explored.    Interventions:  supportive therapy, motivational interviewing, CBT   Diagnoses:    ICD-10-CM   1. Adjustment disorder, unspecified type  F43.20           Plan:  Patient is to utilize coping skills as discussed in session, utilize his support system, maintain his level of functioning by getting adequate rest, journal between sessions, continue to utilize his  support system.  Long-term goals:   Maintain symptom reduction: The patient will report sustained reduction in symptoms of depression, irritability using both CBT and mindfulness interventions. Improve emotional regulation: The patient will learn and apply CBT and mindfulness-based strategies to regulate emotions such as his tendency to get angry and report an improvement in emotional regulation for at least 3 consecutive months progressively.   Short-term goal:  The patient will learn and apply CBT and mindfulness-based coping skills for managing emotional distress and practice using it between sessions.       2.   The patient will CBT and mindfulness-based interventions to increase awareness of negative thought patterns and work to reframe them as needed.       3.   The patient will explore and identify cognitions that add to his feelings of unresolved anger and sadness           Waldron Session, [facility]

## 2023-06-10 ENCOUNTER — Ambulatory Visit: Payer: 59 | Admitting: Mental Health

## 2023-06-10 DIAGNOSIS — F4320 Adjustment disorder, unspecified: Secondary | ICD-10-CM | POA: Diagnosis not present

## 2023-06-10 NOTE — Progress Notes (Signed)
[facility] note  Name: Evan Reyes Date: 06/10/23 MRN: [id number] DOB: [birth year] PCP: [facility]  Time spent: 51 minutes Start time: 8: 00 a.m. stop time: 8: 5 1 AM  Treatment:   ind. therapy  Mental Status Exam:    Appearance:    Casual     Behavior:   Appropriate  Motor:   WNL  Speech/Language:    Clear and Coherent  Affect:   Full range   Mood:   Euthymic  Thought process:   Logical, linear, goal directed  Thought content:     WNL  Sensory/Perceptual disturbances:     none  Orientation:   x4  Attention:   Good  Concentration:   Good  Memory:   Intact  Fund of knowledge:    Consistent with age and development  Insight:     Good  Judgment:    Good  Impulse Control:   Good     Reported Symptoms:  some depressed mood, anger/irritability   Risk Assessment: Danger to Self:  No Self-injurious Behavior: No Danger to Others: No Duty to Warn:no Physical Aggression / Violence:No  Access to Firearms a concern: No  Gang Involvement:No  Patient / guardian was educated about steps to take if suicide or homicide risk level increases between visits: yes While future psychiatric events cannot be accurately predicted, the patient does not currently require acute inpatient psychiatric care and does not currently meet [facility] involuntary commitment criteria.   Medications: Current Outpatient Medications  Medication Sig Dispense Refill   aspirin 81 MG chewable tablet Chew by mouth.     cyclobenzaprine (FLEXERIL) 10 MG tablet Take 1 tablet (10 mg total) by mouth 2 (two) times daily as needed for muscle spasms. 20 tablet 0   glimepiride (AMARYL) 2 MG tablet Take 2 mg by mouth daily.     ibuprofen (ADVIL) 800 MG tablet Take 1 tablet (800 mg total) by mouth 3 (three) times daily. 30 tablet 0   losartan (COZAAR) 50 MG tablet losartan 50 mg tablet     Multiple Vitamins-Minerals (THERA-M) TABS Take by mouth.     Omega-3 Fatty Acids  (FISH OIL) 1000 MG CAPS Take by mouth.     predniSONE (STERAPRED UNI-PAK 21 TAB) 10 MG (21) TBPK tablet Take 6 tabs by mouth daily  for 2 days, then 5 tabs for 2 days, then 4 tabs for 2 days, then 3 tabs for 2 days, 2 tabs for 2 days, then 1 tab by mouth daily for 2 days 42 tablet 0   simvastatin (ZOCOR) 10 MG tablet Take 10 mg by mouth daily.     No current facility-administered medications for this visit.      Subjective:   Patient presents for session on time.  Patient shared progress, expressing frustration, disappointment related to the recent election.  Most of the session was centered around providing support and understanding as patient identified feelings and recent experiences.  He stated that he has spoken with his daughter, where he is trying to be supportive as she also expressed frustration.  Facilitated his focusing on how he can cope and care for himself, taking steps towards self-care, engaging in pleasurable activities and spending time with his girlfriend and others in his support system.    Interventions:  supportive therapy, motivational interviewing, CBT   Diagnoses:    ICD-10-CM   1. Adjustment disorder, unspecified type  F43.20            Plan:  Patient is to utilize coping skills as discussed in session, utilize his support system, maintain his level of functioning by getting adequate rest, journal between sessions, continue to utilize his support system.  Long-term goals:   Maintain symptom reduction: The patient will report sustained reduction in symptoms of depression, irritability using both CBT and mindfulness interventions. Improve emotional regulation: The patient will learn and apply CBT and mindfulness-based strategies to regulate emotions such as his tendency to get angry and report an improvement in emotional regulation for at least 3 consecutive months progressively.   Short-term goal:  The patient will learn and apply CBT and mindfulness-based  coping skills for managing emotional distress and practice using it between sessions.       2.   The patient will CBT and mindfulness-based interventions to increase awareness of negative thought patterns and work to reframe them as needed.       3.   The patient will explore and identify cognitions that add to his feelings of unresolved anger and sadness           Waldron Session, [facility]

## 2023-07-15 ENCOUNTER — Ambulatory Visit (INDEPENDENT_AMBULATORY_CARE_PROVIDER_SITE_OTHER): Payer: 59 | Admitting: Mental Health

## 2023-07-15 DIAGNOSIS — F4320 Adjustment disorder, unspecified: Secondary | ICD-10-CM

## 2023-07-15 NOTE — Progress Notes (Signed)
[facility]  Name: Evan Reyes Date: 07/15/23 MRN: [id number] DOB: [birth year] PCP: [facility]  Time spent: 50 minutes Start time: 8: 00 a.m. stop time: 8: 50 AM  Treatment:   ind. therapy  Mental Status Exam:    Appearance:    Casual     Behavior:   Appropriate  Motor:   WNL  Speech/Language:    Clear and Coherent  Affect:   Full range   Mood:   Euthymic  Thought process:   Logical, linear, goal directed  Thought content:     WNL  Sensory/Perceptual disturbances:     none  Orientation:   x4  Attention:   Good  Concentration:   Good  Memory:   Intact  Fund of knowledge:    Consistent with age and development  Insight:     Good  Judgment:    Good  Impulse Control:   Good     Reported Symptoms:  some depressed mood, anger/irritability   Risk Assessment: Danger to Self:  No Self-injurious Behavior: No Danger to Others: No Duty to Warn:no Physical Aggression / Violence:No  Access to Firearms a concern: No  Gang Involvement:No  Patient / guardian was educated about steps to take if suicide or homicide risk level increases between visits: yes While future psychiatric events cannot be accurately predicted, the patient does not currently require acute inpatient psychiatric care and does not currently meet [facility] involuntary commitment criteria.   Medications: Current Outpatient Medications  Medication Sig Dispense Refill   aspirin 81 MG chewable tablet Chew by mouth.     cyclobenzaprine (FLEXERIL) 10 MG tablet Take 1 tablet (10 mg total) by mouth 2 (two) times daily as needed for muscle spasms. 20 tablet 0   glimepiride (AMARYL) 2 MG tablet Take 2 mg by mouth daily.     ibuprofen (ADVIL) 800 MG tablet Take 1 tablet (800 mg total) by mouth 3 (three) times daily. 30 tablet 0   losartan (COZAAR) 50 MG tablet losartan 50 mg tablet     Multiple Vitamins-Minerals (THERA-M) TABS Take by mouth.     Omega-3 Fatty Acids (FISH  OIL) 1000 MG CAPS Take by mouth.     predniSONE (STERAPRED UNI-PAK 21 TAB) 10 MG (21) TBPK tablet Take 6 tabs by mouth daily  for 2 days, then 5 tabs for 2 days, then 4 tabs for 2 days, then 3 tabs for 2 days, 2 tabs for 2 days, then 1 tab by mouth daily for 2 days 42 tablet 0   simvastatin (ZOCOR) 10 MG tablet Take 10 mg by mouth daily.     No current facility-administered medications for this visit.      Subjective:   Patient presents for session on time.  Assessed recent events where patient shared how he continues to date his girlfriend.  He stated that he did not spend time with her over Thanksgiving as they both have complex family dynamics, patient also stating that he has not told his mother that he has a girlfriend.  He went on to share how his mother can be judgmental, how this is a part of his marriage for many years.  He went on to share more details related to how his mother often made his wife feel judged, how he understands this and was supportive of his wife during that time while they were together.  He plans to spend time with his daughter over Christmas, looks forward to seeing her as  she is not in town often due to being away at school.  He continues to maintain boundaries in the relationship with his sister was identified.  How he For Some Residual Feelings of Frustration Due To Their Being "opposites", referring to their personalities was explored collaboratively.  He has been continuing to work on how he fails their relationship and has been going to reduce feelings of stress and frustration.   Interventions:  supportive therapy, motivational interviewing, CBT   Diagnoses:    ICD-10-CM   1. Adjustment disorder, unspecified type  F43.20        Plan:  Patient is to utilize coping skills as discussed in session, utilize his support system, maintain his level of functioning by getting adequate rest, journal between sessions, continue to utilize his support  system.  Long-term goals:   Maintain symptom reduction: The patient will report sustained reduction in symptoms of depression, irritability using both CBT and mindfulness interventions. Improve emotional regulation: The patient will learn and apply CBT and mindfulness-based strategies to regulate emotions such as his tendency to get angry and report an improvement in emotional regulation for at least 3 consecutive months progressively.   Short-term goal:  The patient will learn and apply CBT and mindfulness-based coping skills for managing emotional distress and practice using it between sessions.       2.   The patient will CBT and mindfulness-based interventions to increase awareness of negative thought patterns and work to reframe them as needed.       3.   The patient will explore and identify cognitions that add to his feelings of unresolved anger and sadness           Waldron Session, [facility]

## 2023-08-12 ENCOUNTER — Ambulatory Visit: Payer: 59 | Admitting: Mental Health

## 2023-08-12 DIAGNOSIS — F4323 Adjustment disorder with mixed anxiety and depressed mood: Secondary | ICD-10-CM

## 2023-08-12 NOTE — Progress Notes (Signed)
 [facility]  Name: NASON CONRADT Date: 08/12/23 MRN: [id number] DOB: 1966-04-29 PCP: [facility]  Time spent: 51 minutes Start time: 8: 00 a.m. stop time: 8: 51 AM  Treatment:   ind. therapy  Mental Status Exam:    Appearance:    Casual     Behavior:   Appropriate  Motor:   WNL  Speech/Language:    Clear and Coherent  Affect:   Full range   Mood:   Euthymic  Thought process:   Logical, linear, goal directed  Thought content:     WNL  Sensory/Perceptual disturbances:     none  Orientation:   x4  Attention:   Good  Concentration:   Good  Memory:   Intact  Fund of knowledge:    Consistent with age and development  Insight:     Good  Judgment:    Good  Impulse Control:   Good     Reported Symptoms:  some depressed mood, anger/irritability   Risk Assessment: Danger to Self:  No Self-injurious Behavior: No Danger to Others: No Duty to Warn:no Physical Aggression / Violence:No  Access to Firearms a concern: No  Gang Involvement:No  Patient / guardian was educated about steps to take if suicide or homicide risk level increases between visits: yes While future psychiatric events cannot be accurately predicted, the patient does not currently require acute inpatient psychiatric care and does not currently meet [facility]  involuntary commitment criteria.   Medications: Current Outpatient Medications  Medication Sig Dispense Refill   aspirin 81 MG chewable tablet Chew by mouth.     cyclobenzaprine  (FLEXERIL ) 10 MG tablet Take 1 tablet (10 mg total) by mouth 2 (two) times daily as needed for muscle spasms. 20 tablet 0   glimepiride (AMARYL) 2 MG tablet Take 2 mg by mouth daily.     ibuprofen  (ADVIL ) 800 MG tablet Take 1 tablet (800 mg total) by mouth 3 (three) times daily. 30 tablet 0   losartan (COZAAR) 50 MG tablet losartan 50 mg tablet     Multiple Vitamins-Minerals (THERA-M) TABS Take by mouth.     Omega-3 Fatty Acids (FISH  OIL) 1000 MG CAPS Take by mouth.     predniSONE  (STERAPRED UNI-PAK 21 TAB) 10 MG (21) TBPK tablet Take 6 tabs by mouth daily  for 2 days, then 5 tabs for 2 days, then 4 tabs for 2 days, then 3 tabs for 2 days, 2 tabs for 2 days, then 1 tab by mouth daily for 2 days 42 tablet 0   simvastatin (ZOCOR) 10 MG tablet Take 10 mg by mouth daily.     No current facility-administered medications for this visit.      Subjective:   Patient presents for session on time.  Assessed progress for patient shared his experiences over the holidays with his daughter.  He stated that she was able to visit about 3 times with him.  It has been about 1 year since he has been able to spend time with her due to her living out of state and attending college.  He stated they were able to spend time with his mother, how this was pleasant; his sister and her family were unable to attend.  He went on to share how he feels he continues to let go of anger he has had about his marriage ending, particularly how his ex-wife handled the situation where she left abruptly with no explanation.  He stated he was able to have  a discussion with his daughter, stating that he is not upset with his ex-wife, that he just wants to be able to communicate with her for the sake of being there for their daughter for such occasion such as graduations and birthdays.  He identified he has no expectations that his wife will change.  Patient was encouraged to recognize how his communication with his daughter strengthens their relationship potentially and further allows him to express his feelings as opposed to suppressing them.  Ways he continues to feel comfortable in the relationship with his girlfriend which shared as he went into detail, identifying in many ways how he has some more open relationship with her than he did his ex-wife.  Interventions:  supportive therapy, motivational interviewing, CBT   Diagnoses:    ICD-10-CM   1. Adjustment disorder  with mixed anxiety and depressed mood  F43.23         Plan:  Patient is to utilize coping skills as discussed in session, utilize his support system, maintain his level of functioning by getting adequate rest, journal between sessions, continue to utilize his support system.  Long-term goals:   Maintain symptom reduction: The patient will report sustained reduction in symptoms of depression, irritability using both CBT and mindfulness interventions. Improve emotional regulation: The patient will learn and apply CBT and mindfulness-based strategies to regulate emotions such as his tendency to get angry and report an improvement in emotional regulation for at least 3 consecutive months progressively.   Short-term goal:  The patient will learn and apply CBT and mindfulness-based coping skills for managing emotional distress and practice using it between sessions.       2.   The patient will CBT and mindfulness-based interventions to increase awareness of negative thought patterns and work to reframe them as needed.       3.   The patient will explore and identify cognitions that add to his feelings of unresolved anger and sadness           Lonni Fischer, [facility]

## 2023-09-16 ENCOUNTER — Ambulatory Visit (INDEPENDENT_AMBULATORY_CARE_PROVIDER_SITE_OTHER): Payer: 59 | Admitting: Mental Health

## 2023-10-14 ENCOUNTER — Ambulatory Visit (INDEPENDENT_AMBULATORY_CARE_PROVIDER_SITE_OTHER): Payer: 59 | Admitting: Mental Health

## 2023-10-14 DIAGNOSIS — F4323 Adjustment disorder with mixed anxiety and depressed mood: Secondary | ICD-10-CM | POA: Diagnosis not present

## 2023-10-14 NOTE — Progress Notes (Signed)
 [facility] note  Name: Evan Reyes Date: 10/14/23 MRN: [id number] DOB: 11-21-1965 PCP: No primary care provider on file.  Time spent: 50 minutes Start time: 8: 00 a.m. stop time: 8: 50 AM  Treatment:   ind. therapy  Mental Status Exam:    Appearance:    Casual     Behavior:   Appropriate  Motor:   WNL  Speech/Language:    Clear and Coherent  Affect:   Full range   Mood:   Euthymic  Thought process:   Logical, linear, goal directed  Thought content:     WNL  Sensory/Perceptual disturbances:     none  Orientation:   x4  Attention:   Good  Concentration:   Good  Memory:   Intact  Fund of knowledge:    Consistent with age and development  Insight:     Good  Judgment:    Good  Impulse Control:   Good     Reported Symptoms:  some depressed mood, anger/irritability   Risk Assessment: Danger to Self:  No Self-injurious Behavior: No Danger to Others: No Duty to Warn:no Physical Aggression / Violence:No  Access to Firearms a concern: No  Gang Involvement:No  Patient / guardian was educated about steps to take if suicide or homicide risk level increases between visits: yes While future psychiatric events cannot be accurately predicted, the patient does not currently require acute inpatient psychiatric care and does not currently meet [facility] involuntary commitment criteria.   Medications: Current Outpatient Medications  Medication Sig Dispense Refill   aspirin 81 MG chewable tablet Chew by mouth.     cyclobenzaprine (FLEXERIL) 10 MG tablet Take 1 tablet (10 mg total) by mouth 2 (two) times daily as needed for muscle spasms. 20 tablet 0   glimepiride (AMARYL) 2 MG tablet Take 2 mg by mouth daily.     ibuprofen (ADVIL) 800 MG tablet Take 1 tablet (800 mg total) by mouth 3 (three) times daily. 30 tablet 0   losartan (COZAAR) 50 MG tablet losartan 50 mg tablet     Multiple Vitamins-Minerals (THERA-M) TABS Take by mouth.     Omega-3 Fatty Acids (FISH  OIL) 1000 MG CAPS Take by mouth.     predniSONE (STERAPRED UNI-PAK 21 TAB) 10 MG (21) TBPK tablet Take 6 tabs by mouth daily  for 2 days, then 5 tabs for 2 days, then 4 tabs for 2 days, then 3 tabs for 2 days, 2 tabs for 2 days, then 1 tab by mouth daily for 2 days 42 tablet 0   simvastatin (ZOCOR) 10 MG tablet Take 10 mg by mouth daily.     No current facility-administered medications for this visit.      Subjective:   Patient presents for session on time.  Patient progress since last visit.  He focused primarily on the relationship with his girlfriend, it continues to go well and he shared other changes to the relationship where they have an additional partner.  How he is adjusting to this which share he feels comfortable and discussed his efforts to ensure that all parties feels supported.  He went on to share how current events have been affecting him, he has tried to utilize his voice for change.  The relationship with his sister with shared, how he maintains a boundary at times in this relationship due to their conflicting views and opinions on various matters.  Ways he tries to be effective, manage his communication effectively without frustration or anger.  Facilitated his identifying ways he feels he can further be effective with his communication in this relationship as well as with others.  Interventions:  supportive therapy, motivational interviewing, CBT   Diagnoses:    ICD-10-CM   1. Adjustment disorder with mixed anxiety and depressed mood  F43.23          Plan:  Patient is to utilize coping skills as discussed in session, utilize his support system, maintain his level of functioning by getting adequate rest, journal between sessions, continue to utilize his support system.  Long-term goals:   Maintain symptom reduction: The patient will report sustained reduction in symptoms of depression, irritability using both CBT and mindfulness interventions. Improve emotional  regulation: The patient will learn and apply CBT and mindfulness-based strategies to regulate emotions such as his tendency to get angry and report an improvement in emotional regulation for at least 3 consecutive months progressively.   Short-term goal:  The patient will learn and apply CBT and mindfulness-based coping skills for managing emotional distress and practice using it between sessions.       2.   The patient will CBT and mindfulness-based interventions to increase awareness of negative thought patterns and work to reframe them as needed.       3.   The patient will explore and identify cognitions that add to his feelings of unresolved anger and sadness           Waldron Session, [facility]

## 2023-11-11 ENCOUNTER — Ambulatory Visit: Payer: 59 | Admitting: Mental Health

## 2023-11-11 DIAGNOSIS — F4323 Adjustment disorder with mixed anxiety and depressed mood: Secondary | ICD-10-CM | POA: Diagnosis not present

## 2023-11-11 NOTE — Progress Notes (Signed)
 [facility] note  Name: Evan Reyes Date: 11/11/23 MRN: [id number] DOB: 24-Apr-1966 PCP: No primary care provider on file.  Time spent: 51 minutes Start time: 8: 00 a.m. stop time: 8: 51 AM  Treatment:   ind. therapy  Mental Status Exam:    Appearance:    Casual     Behavior:   Appropriate  Motor:   WNL  Speech/Language:    Clear and Coherent  Affect:   Full range   Mood:   Euthymic  Thought process:   Logical, linear, goal directed  Thought content:     WNL  Sensory/Perceptual disturbances:     none  Orientation:   x4  Attention:   Good  Concentration:   Good  Memory:   Intact  Fund of knowledge:    Consistent with age and development  Insight:     Good  Judgment:    Good  Impulse Control:   Good     Reported Symptoms:  some depressed mood, anger/irritability   Risk Assessment: Danger to Self:  No Self-injurious Behavior: No Danger to Others: No Duty to Warn:no Physical Aggression / Violence:No  Access to Firearms a concern: No  Gang Involvement:No  Patient / guardian was educated about steps to take if suicide or homicide risk level increases between visits: yes While future psychiatric events cannot be accurately predicted, the patient does not currently require acute inpatient psychiatric care and does not currently meet [facility]  involuntary commitment criteria.   Medications: Current Outpatient Medications  Medication Sig Dispense Refill   aspirin 81 MG chewable tablet Chew by mouth.     cyclobenzaprine (FLEXERIL) 10 MG tablet Take 1 tablet (10 mg total) by mouth 2 (two) times daily as needed for muscle spasms. 20 tablet 0   glimepiride (AMARYL) 2 MG tablet Take 2 mg by mouth daily.     ibuprofen (ADVIL) 800 MG tablet Take 1 tablet (800 mg total) by mouth 3 (three) times daily. 30 tablet 0   losartan (COZAAR) 50 MG tablet losartan 50 mg tablet     Multiple Vitamins-Minerals (THERA-M) TABS Take by mouth.     Omega-3 Fatty Acids (FISH  OIL) 1000 MG CAPS Take by mouth.     predniSONE (STERAPRED UNI-PAK 21 TAB) 10 MG (21) TBPK tablet Take 6 tabs by mouth daily  for 2 days, then 5 tabs for 2 days, then 4 tabs for 2 days, then 3 tabs for 2 days, 2 tabs for 2 days, then 1 tab by mouth daily for 2 days 42 tablet 0   simvastatin (ZOCOR) 10 MG tablet Take 10 mg by mouth daily.     No current facility-administered medications for this visit.      Subjective:   Patient presents for session on time.  He shared progress, how he got upset with his roommate recently.  He stated that he has lived with his roommate for the past 2 years, he has been helpful to her by paying all the bills, she makes some contributions but he is primarily financially responsible.  He went on to share how she suffered an injury recently and continued throughout the following days complaining about the injury after getting medical care.  He stated that he raised his voice after a few days had gone by, frustrated about her wanting to talk with him, due to her injury continuing to be painful.  Mental complaints related to their relationship, how their) however, he stated that she often will rely on  others going on to share many examples.  Explored collaboratively ways to manage his frustrations, provided support and understanding.  He identified the need to continue to use his support system which consists of other friends as well. 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  He identified wanting to have more of a "normal schedule" referring to being home in the evenings and having more quality time with others.  He plans to follow through in looking for options.  Interventions:  supportive therapy, motivational interviewing, CBT   Diagnoses:    ICD-10-CM   1. Adjustment disorder with mixed anxiety and depressed mood  F43.23          Plan:  Patient is to utilize coping skills as discussed in session, utilize his support system, maintain his level of functioning by getting adequate rest, journal between sessions, continue to utilize his support system.  Long-term goals:   Maintain symptom reduction: The patient will report sustained reduction in symptoms of depression, irritability using both CBT and mindfulness interventions. Improve emotional regulation: The patient will learn and apply CBT and mindfulness-based strategies to regulate emotions such as his tendency to get angry and report an improvement in emotional regulation for at least 3 consecutive months progressively.   Short-term goal:  The patient will learn and apply CBT and mindfulness-based coping skills for managing emotional distress and practice using it between sessions.       2.   The patient will CBT and mindfulness-based interventions to increase awareness of negative thought patterns and work to reframe them as needed.       3.   The patient will explore and identify cognitions that add to his feelings of unresolved anger and sadness           Avram Lenis, [facility]

## 2023-11-25 ENCOUNTER — Ambulatory Visit
Admission: EM | Admit: 2023-11-25 | Discharge: 2023-11-25 | Disposition: A | Discharge status: Home / Self Care | Attending: Nurse Practitioner | Admitting: Nurse Practitioner

## 2023-11-25 DIAGNOSIS — R110 Nausea: Secondary | ICD-10-CM | POA: Diagnosis not present

## 2023-11-25 DIAGNOSIS — R42 Dizziness and giddiness: Secondary | ICD-10-CM | POA: Diagnosis not present

## 2023-11-25 LAB — POCT FASTING CBG KUC MANUAL ENTRY: POCT Glucose (KUC): 151 mg/dL — AB (ref 70–99)

## 2023-11-25 MED ORDER — MECLIZINE HCL 12.5 MG PO TABS: 12.5000 mg | ORAL_TABLET | Freq: Three times a day (TID) | ORAL | 0 refills | Status: AC | PRN

## 2023-11-25 NOTE — ED Triage Notes (Signed)
 Pt states he felt dizzy and nauseous x 2 days. Pt though his blood sugar was low but was not able to check it at home. Pt states he drunk a soda and ate breakfast and felt better now, but still having some dizziness when turning head.

## 2023-11-25 NOTE — Discharge Instructions (Signed)
 As we discussed, it sounds like you had a low blood sugar episode this morning.  Blood sugar today in urgent care is stable, vital signs look great, and examination today is reassuring.  Recommend start high protein snack with dose of glimepiride in the evening to prevent future low blood sugar episodes.  Also keep a sugary snack on hand to have easy access to if you develop another low blood sugar episode.  If episodes recur despite high protein snacks, recommend close follow up with PCP.   I sent meclizine to the pharmacy if the vertigo sensation comes back without nausea/sweating; be careful when taking this medication as it may cause drowsiness.

## 2023-11-25 NOTE — ED Provider Notes (Signed)
 [facility]    CSN: [id number] Arrival date & time: 11/25/23  1134      History   Chief Complaint Chief Complaint  Patient presents with   Dizziness    HPI Evan Reyes is a 58 y.o. male.   Patient presents today with feeling of dizziness/vertigo sensation 2 days ago that fully resolved yesterday and then recurred this morning.  Reports when he was going to get out of bed, he noticed slight woozy feeling, some room spinning sensation, nausea, and diaphoresis.  He takes glimepiride for type 2 diabetes and does not have a glucometer that is working at home.  Reports shortly after, he drank a little bit of soda and symptoms seem to slowly resolve.  The woozy feeling is not fully resolved, but he was still little bit woozy on his way to urgent care today.  He denies blurred or double vision, changes in cognition, difficulty swallowing, recent head injury, fall, or trauma, and recent viral symptoms.  Reports when he had the vertigo/dizziness sensation 2 days ago, he did not have the nausea or diaphoresis.  No significant change in appetite over the past couple of days.  Patient reports he has been more tired than normal as he works 5 days/week and works around his house on the weekends.  No chest pain or shortness of breath that occurred with the vertigo/dizziness sensation.    Past Medical History:  Diagnosis Date   Diabetes mellitus without complication (HCC)    Hypertension     There are no active problems to display for this patient.   Past Surgical History:  Procedure Laterality Date   INNER EAR SURGERY     KNEE SURGERY     MASTOIDECTOMY     WISDOM TOOTH EXTRACTION         Home Medications    Prior to Admission medications   Medication Sig Start Date End Date Taking? Authorizing Provider  aspirin 81 MG chewable tablet Chew by mouth.   Yes [provider]  glimepiride (AMARYL) 2 MG tablet Take 2 mg by mouth daily. 09/18/19  Yes [provider]  losartan (COZAAR) 50 MG tablet losartan 50 mg tablet   Yes [provider]  meclizine (ANTIVERT) 12.5 MG tablet Take 1 tablet (12.5 mg total) by mouth 3 (three) times daily as needed for dizziness. Do not take with alcohol or while driving or operating heavy machinery.  May cause drowsiness. 11/25/23  Yes Wilhemena Harbour, NP  Multiple Vitamins-Minerals (THERA-M) TABS Take by mouth.   Yes [provider]  Omega-3 Fatty Acids (FISH OIL) 1000 MG CAPS Take by mouth.   Yes [provider]  simvastatin (ZOCOR) 10 MG tablet Take 10 mg by mouth daily. 09/19/19  Yes [provider]  ibuprofen  (ADVIL ) 800 MG tablet Take 1 tablet (800 mg total) by mouth 3 (three) times daily. 11/30/19   Avegno, Komlanvi S, FNP    Family History Family History  Problem Relation Age of Onset   Multiple sclerosis Father     Social History Social History   Tobacco Use   Smoking status: Former   Smokeless tobacco: Never  Vaping Use   Vaping status: Never Used  Substance Use Topics   Alcohol use: No   Drug use: No     Allergies   Patient has no known allergies.   Review of Systems Review of Systems Per HPI  Physical Exam Triage Vital Signs ED Triage Vitals [11/25/23 1333]  Encounter Vitals Group     BP (!) 158/63     Systolic BP Percentile      Diastolic BP Percentile      Pulse Rate 61     Resp 16     Temp 97.6 F (36.4 C)     Temp Source Oral     SpO2 98 %     Weight      Height      Head Circumference      Peak Flow      Pain Score 0     Pain Loc      Pain Education      Exclude from Growth Chart    No data found.  Updated Vital Signs BP (!) 158/63 (BP Location: Right Arm)   Pulse 61   Temp 97.6 F (36.4 C) (Oral)   Resp 16   SpO2 98%   Visual Acuity Right Eye Distance:   Left Eye Distance:   Bilateral Distance:    Right Eye Near:   Left Eye Near:    Bilateral Near:     Physical Exam Vitals and nursing note reviewed.   Constitutional:      General: He is not in acute distress.    Appearance: Normal appearance. He is not ill-appearing, toxic-appearing or diaphoretic.  HENT:     Head: Normocephalic and atraumatic.     Right Ear: Tympanic membrane, ear canal and external ear normal. There is no impacted cerumen.     Left Ear: Tympanic membrane, ear canal and external ear normal. There is no impacted cerumen.     Nose: Nose normal. No congestion or rhinorrhea.     Mouth/Throat:     Mouth: Mucous membranes are moist.     Pharynx: Oropharynx is clear. No posterior oropharyngeal erythema.  Eyes:     General: No scleral icterus.    Extraocular Movements: Extraocular movements intact.     Pupils: Pupils are equal, round, and reactive to light.  Cardiovascular:     Rate and Rhythm: Normal rate and regular rhythm.     Heart sounds: No murmur heard. Pulmonary:     Effort: Pulmonary effort is normal. No respiratory distress.     Breath sounds: Normal breath sounds. No wheezing, rhonchi or rales.  Abdominal:     General: Abdomen is flat. Bowel sounds are normal. There is no distension.     Palpations: Abdomen is soft.     Tenderness: There is no abdominal tenderness. There is no right CVA tenderness, left CVA tenderness or guarding.  Musculoskeletal:     Cervical back: Normal range of motion and neck supple. No rigidity or tenderness.  Lymphadenopathy:     Cervical: No cervical adenopathy.  Skin:    General: Skin is warm and dry.     Capillary Refill: Capillary refill takes less than 2 seconds.     Coloration: Skin is not jaundiced or pale.     Findings: No erythema.  Neurological:     General: No focal deficit present.     Mental Status: He is alert and oriented to person, place, and time.     Cranial Nerves: Cranial nerves 2-12 are intact.     Sensory: Sensation is intact.     Motor: No weakness.     Coordination: Coordination is intact. Coordination normal.     Gait: Gait is intact. Gait normal.   Psychiatric:        Behavior: Behavior is cooperative.  UC Treatments / Results  Labs (all labs ordered are listed, but only abnormal results are displayed) Labs Reviewed  POCT FASTING CBG [facility] MANUAL ENTRY - Abnormal; Notable for the following components:      Result Value   POCT Glucose ([facility]) 151 (*)    All other components within normal limits    EKG   Radiology No results found.  Procedures Procedures (including critical care time)  Medications Ordered in UC Medications - No data to display  Initial Impression / Assessment and Plan / UC Course  I have reviewed the triage vital signs and the nursing notes.  Pertinent labs & imaging results that were available during my care of the patient were reviewed by me and considered in my medical decision making (see chart for details).   Patient is mildly hypertensive in triage, otherwise vital signs are stable.  1. Dizziness 2. Nausea without vomiting Vitals are stable in triage and neurological exam is reassuring No red flags Suspect hypoglycemic episode earlier today that is now resolved Recommended ingesting high-protein snack with glimepiride, close follow-up with primary care provider if symptoms recur Prescription given for meclizine in the meantime if vertigo recurs in absence of nausea and diaphoresis to use sparingly and not before driving or operating heavy machinery  The patient was given the opportunity to ask questions.  All questions answered to their satisfaction.  The patient is in agreement to this plan.   Final Clinical Impressions(s) / UC Diagnoses   Final diagnoses:  Dizziness  Nausea without vomiting     Discharge Instructions      As we discussed, it sounds like you had a low blood sugar episode this morning.  Blood sugar today in urgent care is stable, vital signs look great, and examination today is reassuring.  Recommend start high protein snack with dose of glimepiride in the evening to  prevent future low blood sugar episodes.  Also keep a sugary snack on hand to have easy access to if you develop another low blood sugar episode.  If episodes recur despite high protein snacks, recommend close follow up with PCP.   I sent meclizine to the pharmacy if the vertigo sensation comes back without nausea/sweating; be careful when taking this medication as it may cause drowsiness.     ED Prescriptions     Medication Sig Dispense Auth. Provider   meclizine (ANTIVERT) 12.5 MG tablet Take 1 tablet (12.5 mg total) by mouth 3 (three) times daily as needed for dizziness. Do not take with alcohol or while driving or operating heavy machinery.  May cause drowsiness. 30 tablet Wilhemena Harbour, NP      PDMP not reviewed this encounter.   Wilhemena Harbour, NP 11/25/23 [phone]

## 2023-12-09 ENCOUNTER — Ambulatory Visit (INDEPENDENT_AMBULATORY_CARE_PROVIDER_SITE_OTHER): Admitting: Mental Health

## 2023-12-09 DIAGNOSIS — F4323 Adjustment disorder with mixed anxiety and depressed mood: Secondary | ICD-10-CM | POA: Diagnosis not present

## 2023-12-09 NOTE — Progress Notes (Signed)
 [facility]  Name: Evan Reyes Date: 12/09/23 MRN: [id number] DOB: [birth year] PCP: [facility]  Time spent: 50 minutes Start time: 8: 00 a.m. stop time: 8: 50 AM  Treatment:   ind. therapy  Mental Status Exam:    Appearance:    Casual     Behavior:   Appropriate  Motor:   WNL  Speech/Language:    Clear and Coherent  Affect:   Full range   Mood:   Euthymic  Thought process:   Logical, linear, goal directed  Thought content:     WNL  Sensory/Perceptual disturbances:     none  Orientation:   x4  Attention:   Good  Concentration:   Good  Memory:   Intact  Fund of knowledge:    Consistent with age and development  Insight:     Good  Judgment:    Good  Impulse Control:   Good     Reported Symptoms:  some depressed mood, anger/irritability   Risk Assessment: Danger to Self:  No Self-injurious Behavior: No Danger to Others: No Duty to Warn:no Physical Aggression / Violence:No  Access to Firearms a concern: No  Gang Involvement:No  Patient / guardian was educated about steps to take if suicide or homicide risk level increases between visits: yes While future psychiatric events cannot be accurately predicted, the patient does not currently require acute inpatient psychiatric care and does not currently meet [place]  involuntary commitment criteria.   Medications: Current Outpatient Medications  Medication Sig Dispense Refill   aspirin 81 MG chewable tablet Chew by mouth.     glimepiride (AMARYL) 2 MG tablet Take 2 mg by mouth daily.     ibuprofen  (ADVIL ) 800 MG tablet Take 1 tablet (800 mg total) by mouth 3 (three) times daily. 30 tablet 0   losartan (COZAAR) 50 MG tablet losartan 50 mg tablet     meclizine  (ANTIVERT ) 12.5 MG tablet Take 1 tablet (12.5 mg total) by mouth 3 (three) times daily as needed for dizziness. Do not take with alcohol or while driving or operating heavy machinery.  May cause drowsiness. 30 tablet 0    Multiple Vitamins-Minerals (THERA-M) TABS Take by mouth.     Omega-3 Fatty Acids (FISH OIL) 1000 MG CAPS Take by mouth.     simvastatin (ZOCOR) 10 MG tablet Take 10 mg by mouth daily.     No current facility-administered medications for this visit.      Subjective:   Patient presents for session on time.  Assessed progress where he shared how he continues to be supportive of his roommate.  He stated that his roommate/friend has been living with him for 2 years.  He went on to share how they have not taken steps as they discussed initially when she moved into his residence.  He stated that he is provided support and encouragement for her to working on gaining her own independence, however, she has continued to not work.  He recognizes that she has challenges, some medical issues but she continues to not take steps to find employment, explore potential services such as mental health.  Provide resource she could utilize to access care.  He plans to further discuss with her options, provide this information while also setting some boundaries.  Ways to do so were explored collaboratively where he plans to give her time on on when she will have to move out as he identified this as a significant need in his life to cope  and care for himself.  Interventions:  supportive therapy, motivational interviewing, CBT   Diagnoses:  No diagnosis found.     Plan:  Patient is to utilize coping skills as discussed in session, utilize his support system, maintain his level of functioning by getting adequate rest, journal between sessions, continue to utilize his support system.  Long-term goals:   Maintain symptom reduction: The patient will report sustained reduction in symptoms of depression, irritability using both CBT and mindfulness interventions. Improve emotional regulation: The patient will learn and apply CBT and mindfulness-based strategies to regulate emotions such as his tendency to get angry and  report an improvement in emotional regulation for at least 3 consecutive months progressively.   Short-term goal:  The patient will learn and apply CBT and mindfulness-based coping skills for managing emotional distress and practice using it between sessions.       2.   The patient will CBT and mindfulness-based interventions to increase awareness of negative thought patterns and work to reframe them as needed.       3.   The patient will explore and identify cognitions that add to his feelings of unresolved anger and sadness           Avram Lenis, [facility]

## 2024-01-13 ENCOUNTER — Ambulatory Visit (INDEPENDENT_AMBULATORY_CARE_PROVIDER_SITE_OTHER): Admitting: Mental Health

## 2024-01-13 DIAGNOSIS — F4323 Adjustment disorder with mixed anxiety and depressed mood: Secondary | ICD-10-CM

## 2024-01-13 NOTE — Progress Notes (Signed)
 [facility] note  Name: Evan Reyes Date: 01/13/24 MRN: [id number] DOB: 04/18/1966 PCP: [facility]  Time spent: 51 minutes Start time: 8: 00 a.m. stop time: 8: 51 AM  Treatment:   ind. therapy  Mental Status Exam:    Appearance:    Casual     Behavior:   Appropriate  Motor:   WNL  Speech/Language:    Clear and Coherent  Affect:   Full range   Mood:   Euthymic  Thought process:   Logical, linear, goal directed  Thought content:     WNL  Sensory/Perceptual disturbances:     none  Orientation:   x4  Attention:   Good  Concentration:   Good  Memory:   Intact  Fund of knowledge:    Consistent with age and development  Insight:     Good  Judgment:    Good  Impulse Control:   Good     Reported Symptoms:  some depressed mood, anger/irritability   Risk Assessment: Danger to Self:  No Self-injurious Behavior: No Danger to Others: No Duty to Warn:no Physical Aggression / Violence:No  Access to Firearms a concern: No  Gang Involvement:No  Patient / guardian was educated about steps to take if suicide or homicide risk level increases between visits: yes While future psychiatric events cannot be accurately predicted, the patient does not currently require acute inpatient psychiatric care and does not currently meet [place]  involuntary commitment criteria.   Medications: Current Outpatient Medications  Medication Sig Dispense Refill   aspirin 81 MG chewable tablet Chew by mouth.     glimepiride (AMARYL) 2 MG tablet Take 2 mg by mouth daily.     ibuprofen  (ADVIL ) 800 MG tablet Take 1 tablet (800 mg total) by mouth 3 (three) times daily. 30 tablet 0   losartan (COZAAR) 50 MG tablet losartan 50 mg tablet     meclizine  (ANTIVERT ) 12.5 MG tablet Take 1 tablet (12.5 mg total) by mouth 3 (three) times daily as needed for dizziness. Do not take with alcohol or while driving or operating heavy machinery.  May cause drowsiness. 30 tablet 0    Multiple Vitamins-Minerals (THERA-M) TABS Take by mouth.     Omega-3 Fatty Acids (FISH OIL) 1000 MG CAPS Take by mouth.     simvastatin (ZOCOR) 10 MG tablet Take 10 mg by mouth daily.     No current facility-administered medications for this visit.      Subjective:   Patient presents for session on time.  He shared challenging news recently, learning from his girlfriend that her adult daughter has stage IV cancer.  He stated this, along with his roommate also being diagnosed with intestinal cancer, has been concerning.  He went on to share how he is trying to be supportive to both of them.  He stated that he had a pleasant Father's Day, received a call from his daughter which was meaningful, stated they talk most every Sunday typically.  Through further exploration, he shared how he has a history of having to suppress emotions to try and cope and manage through difficult times such as the passing of his father during his adolescence and other instances in his life.  He further identified feeling an increased sense of self worth and confidence through some of the relationships he has forged over the last 1 to 2 years, most notably with his girlfriends.  He was able to identify how this is significantly different particularly after his wife  leaving him a few years ago where he questioned his self-worth.  How this connects to other feelings, his anger which, he feels he continues to make progress toward managing more effectively going on to share details.  Interventions:  supportive therapy, motivational interviewing, CBT   Diagnoses:    ICD-10-CM   1. Adjustment disorder with mixed anxiety and depressed mood  F43.23          Plan:  Patient is to utilize coping skills as discussed in session, utilize his support system, maintain his level of functioning by getting adequate rest, journal between sessions, continue to utilize his support system.  Long-term goals:   Maintain symptom  reduction: The patient will report sustained reduction in symptoms of depression, irritability using both CBT and mindfulness interventions. Improve emotional regulation: The patient will learn and apply CBT and mindfulness-based strategies to regulate emotions such as his tendency to get angry and report an improvement in emotional regulation for at least 3 consecutive months progressively.   Short-term goal:  The patient will learn and apply CBT and mindfulness-based coping skills for managing emotional distress and practice using it between sessions.       2.   The patient will CBT and mindfulness-based interventions to increase awareness of negative thought patterns and work to reframe them as needed.       3.   The patient will explore and identify cognitions that add to his feelings of unresolved anger and sadness           Avram Lenis, [facility]

## 2024-02-10 ENCOUNTER — Ambulatory Visit (INDEPENDENT_AMBULATORY_CARE_PROVIDER_SITE_OTHER): Admitting: Mental Health

## 2024-02-10 DIAGNOSIS — F4323 Adjustment disorder with mixed anxiety and depressed mood: Secondary | ICD-10-CM

## 2024-02-10 NOTE — Progress Notes (Signed)
 [facility]  Name: Evan Reyes Date: 02/10/24 MRN: [id number] DOB: 1965/08/14 PCP: [facility]  Time spent: 50 minutes Start time: 8: 00 a.m. stop time: 8: 50 AM  Treatment:   ind. therapy  Mental Status Exam:    Appearance:    Casual     Behavior:   Appropriate  Motor:   WNL  Speech/Language:    Clear and Coherent  Affect:   Full range   Mood:   Euthymic  Thought process:   Logical, linear, goal directed  Thought content:     WNL  Sensory/Perceptual disturbances:     none  Orientation:   x4  Attention:   Good  Concentration:   Good  Memory:   Intact  Fund of knowledge:    Consistent with age and development  Insight:     Good  Judgment:    Good  Impulse Control:   Good     Reported Symptoms:  some depressed mood, anger/irritability   Risk Assessment: Danger to Self:  No Self-injurious Behavior: No Danger to Others: No Duty to Warn:no Physical Aggression / Violence:No  Access to Firearms a concern: No  Gang Involvement:No  Patient / guardian was educated about steps to take if suicide or homicide risk level increases between visits: yes While future psychiatric events cannot be accurately predicted, the patient does not currently require acute inpatient psychiatric care and does not currently meet [place]  involuntary commitment criteria.   Medications: Current Outpatient Medications  Medication Sig Dispense Refill   aspirin 81 MG chewable tablet Chew by mouth.     glimepiride (AMARYL) 2 MG tablet Take 2 mg by mouth daily.     ibuprofen  (ADVIL ) 800 MG tablet Take 1 tablet (800 mg total) by mouth 3 (three) times daily. 30 tablet 0   losartan (COZAAR) 50 MG tablet losartan 50 mg tablet     meclizine  (ANTIVERT ) 12.5 MG tablet Take 1 tablet (12.5 mg total) by mouth 3 (three) times daily as needed for dizziness. Do not take with alcohol or while driving or operating heavy machinery.  May cause drowsiness. 30 tablet 0    Multiple Vitamins-Minerals (THERA-M) TABS Take by mouth.     Omega-3 Fatty Acids (FISH OIL) 1000 MG CAPS Take by mouth.     simvastatin (ZOCOR) 10 MG tablet Take 10 mg by mouth daily.     No current facility-administered medications for this visit.      Subjective:   Patient presents for session on time.  Assessed progress. He shared ongoing stressors primarily it says head with work. 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 Facilitated his identifying needs where he is considering a different line of work, a completely different career path in [employer]	[place]. He shared research he has done to help him decide his options. At this point he's uncertain. Explores collaboratively benefits, challenges he may experience. 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 He values his relationships with his girlfriends, sharing experiences they've had with one another, finding their support  and understanding meaningful to him.    Interventions:  supportive therapy, motivational interviewing, CBT   Diagnoses:  No diagnosis found.      Plan:  Patient is to utilize coping skills as discussed in session, utilize his support system, maintain his level of functioning by getting adequate rest, journal between sessions, continue to utilize his support system.  Long-term goals:   Maintain symptom reduction: The patient will report sustained reduction in symptoms of depression, irritability using both CBT and mindfulness interventions. Improve emotional regulation:  The patient will learn and apply CBT and mindfulness-based strategies to regulate emotions such as his tendency to get angry and report an improvement in emotional regulation for at least 3 consecutive months progressively.   Short-term goal:  The patient will learn and apply CBT and mindfulness-based coping skills for managing emotional distress and practice using it between sessions.       2.   The patient will CBT and mindfulness-based interventions to increase awareness of negative thought patterns and work to reframe them as needed.       3.   The patient will explore and identify cognitions that add to his feelings of unresolved anger and sadness           Lonni Fischer, [facility]

## 2024-03-09 ENCOUNTER — Ambulatory Visit (INDEPENDENT_AMBULATORY_CARE_PROVIDER_SITE_OTHER): Admitting: Mental Health

## 2024-03-09 DIAGNOSIS — F4323 Adjustment disorder with mixed anxiety and depressed mood: Secondary | ICD-10-CM | POA: Diagnosis not present

## 2024-03-09 NOTE — Progress Notes (Signed)
 [facility]  Name: Evan Reyes Date: 03/09/24 MRN: [id number] DOB: Aug 26, 1965 PCP: No primary care provider on file.  Time spent: 49 minutes Start time: 8: 01 a.m. stop time: 8: 50 AM  Treatment:   ind. therapy  Mental Status Exam:    Appearance:    Casual     Behavior:   Appropriate  Motor:   WNL  Speech/Language:    Clear and Coherent  Affect:   Full range   Mood:   Euthymic  Thought process:   Logical, linear, goal directed  Thought content:     WNL  Sensory/Perceptual disturbances:     none  Orientation:   x4  Attention:   Good  Concentration:   Good  Memory:   Intact  Fund of knowledge:    Consistent with age and development  Insight:     Good  Judgment:    Good  Impulse Control:   Good     Reported Symptoms:  some depressed mood, anger/irritability   Risk Assessment: Danger to Self:  No Self-injurious Behavior: No Danger to Others: No Duty to Warn:no Physical Aggression / Violence:No  Access to Firearms a concern: No  Gang Involvement:No  Patient / guardian was educated about steps to take if suicide or homicide risk level increases between visits: yes While future psychiatric events cannot be accurately predicted, the patient does not currently require acute inpatient psychiatric care and does not currently meet [place]  involuntary commitment criteria.   Medications: Current Outpatient Medications  Medication Sig Dispense Refill   aspirin 81 MG chewable tablet Chew by mouth.     glimepiride (AMARYL) 2 MG tablet Take 2 mg by mouth daily.     ibuprofen  (ADVIL ) 800 MG tablet Take 1 tablet (800 mg total) by mouth 3 (three) times daily. 30 tablet 0   losartan (COZAAR) 50 MG tablet losartan 50 mg tablet     meclizine  (ANTIVERT ) 12.5 MG tablet Take 1 tablet (12.5 mg total) by mouth 3 (three) times daily as needed for dizziness. Do not take with alcohol or while driving or operating heavy machinery.  May cause drowsiness. 30 tablet 0    Multiple Vitamins-Minerals (THERA-M) TABS Take by mouth.     Omega-3 Fatty Acids (FISH OIL) 1000 MG CAPS Take by mouth.     simvastatin (ZOCOR) 10 MG tablet Take 10 mg by mouth daily.     No current facility-administered medications for this visit.      Subjective:   Patient presents for session on time.  He shared how he has made an important decision about his career.  He stated that he has put in notice of his current job and his last days the end of next month.  He went on to share reasons for making this decision, some of which were discussed over the last couple of sessions.  At this point, he feels it is part of his way to cope and care for himself as his current job has been stressful in many aspects.  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  He went on to share how he has had good conversations with his daughter recently, continues to have no contact with his ex-wife.  Through further guided discovery, he continues to identify the need to have a discussion with his ex-wife with the primary  purpose of being able to have a level of communication when they are around 1 another 1 in their daughter's behalf.  He stated he wants to have cordial, polite communication with her although he recognizes the complexity of how the relationship ended as she left abruptly.  Interventions:  supportive therapy, motivational interviewing, CBT   Diagnoses:    ICD-10-CM   1. Adjustment disorder with mixed anxiety and depressed mood  F43.23           Plan:  Patient is to utilize coping skills as discussed in session, utilize his support system, maintain his level of functioning by getting adequate rest, journal between sessions, continue to utilize his support system.  Long-term goals:   Maintain symptom reduction: The patient will report sustained reduction in symptoms  of depression, irritability using both CBT and mindfulness interventions. Improve emotional regulation: The patient will learn and apply CBT and mindfulness-based strategies to regulate emotions such as his tendency to get angry and report an improvement in emotional regulation for at least 3 consecutive months progressively.   Short-term goal:  The patient will learn and apply CBT and mindfulness-based coping skills for managing emotional distress and practice using it between sessions.       2.   The patient will CBT and mindfulness-based interventions to increase awareness of negative thought patterns and work to reframe them as needed.       3.   The patient will explore and identify cognitions that add to his feelings of unresolved anger and sadness           Lonni Fischer, [facility]

## 2024-04-06 ENCOUNTER — Ambulatory Visit (INDEPENDENT_AMBULATORY_CARE_PROVIDER_SITE_OTHER): Admitting: Nurse Practitioner

## 2024-04-06 ENCOUNTER — Other Ambulatory Visit: Payer: Self-pay

## 2024-04-06 ENCOUNTER — Encounter: Payer: Self-pay | Admitting: Nurse Practitioner

## 2024-04-06 VITALS — BP 143/84 | HR 73 | Ht 74.0 in | Wt 303.0 lb

## 2024-04-06 DIAGNOSIS — E119 Type 2 diabetes mellitus without complications: Secondary | ICD-10-CM | POA: Diagnosis not present

## 2024-04-06 DIAGNOSIS — Z1211 Encounter for screening for malignant neoplasm of colon: Secondary | ICD-10-CM

## 2024-04-06 DIAGNOSIS — Z7984 Long term (current) use of oral hypoglycemic drugs: Secondary | ICD-10-CM

## 2024-04-06 DIAGNOSIS — E663 Overweight: Secondary | ICD-10-CM

## 2024-04-06 DIAGNOSIS — M1990 Unspecified osteoarthritis, unspecified site: Secondary | ICD-10-CM

## 2024-04-06 DIAGNOSIS — R5383 Other fatigue: Secondary | ICD-10-CM | POA: Diagnosis not present

## 2024-04-06 NOTE — Patient Instructions (Signed)
 1) Health maintenance - Patient set up for screening colonoscopy appt with GI 2) DMII - fasting labs today - A1c, CMP, TSH, FLP 3) Follow up appt in 6 months

## 2024-04-06 NOTE — Progress Notes (Signed)
 New Patient Office Visit  Subjective    Patient ID: KOLBI ALTADONNA, male    DOB: 1966-04-04  Age: 58 y.o. MRN: [id number]  CC:  Chief Complaint  Patient presents with   Establish Care    HPI LEILAND MIHELICH presents to establish care Patient here today to establish primarycare.  Patient has had previous primary care provider refill his maintenance meds.  Patient works 65-75 hours per week.  Hx of sleep apnea.  Last labs late 2024 or early 2025.    Outpatient Encounter Medications as of 04/06/2024  Medication Sig   aspirin 81 MG chewable tablet Chew by mouth.   emtricitabine-tenofovir (TRUVADA) 200-300 MG tablet Take 1 tablet by mouth daily.   glimepiride (AMARYL) 2 MG tablet Take 2 mg by mouth daily.   ibuprofen  (ADVIL ) 800 MG tablet Take 1 tablet (800 mg total) by mouth 3 (three) times daily.   losartan (COZAAR) 50 MG tablet losartan 50 mg tablet   Multiple Vitamins-Minerals (THERA-M) TABS Take by mouth.   Omega-3 Fatty Acids (FISH OIL) 1000 MG CAPS Take by mouth.   simvastatin (ZOCOR) 10 MG tablet Take 10 mg by mouth daily.   meclizine  (ANTIVERT ) 12.5 MG tablet Take 1 tablet (12.5 mg total) by mouth 3 (three) times daily as needed for dizziness. Do not take with alcohol or while driving or operating heavy machinery.  May cause drowsiness. (Patient not taking: Reported on 04/06/2024)   No facility-administered encounter medications on file as of 04/06/2024.    Past Medical History:  Diagnosis Date   Diabetes mellitus without complication (HCC)    Hypertension     Past Surgical History:  Procedure Laterality Date   INNER EAR SURGERY     KNEE SURGERY     MASTOIDECTOMY     WISDOM TOOTH EXTRACTION      Family History  Problem Relation Age of Onset   Multiple sclerosis Father     Social History   Socioeconomic History   Marital status: Married    Spouse name: Not on file   Number of children: 1   Years of education: 16   Highest education level: Bachelor's  degree (e.g., BA, AB, BS)  Occupational History   Not on file  Tobacco Use   Smoking status: Former   Smokeless tobacco: Never  Vaping Use   Vaping status: Never Used  Substance and Sexual Activity   Alcohol use: No   Drug use: No   Sexual activity: Not on file  Other Topics Concern   Not on file  Social History Narrative   Lives in [place] alone.   Social Drivers of Corporate investment banker Strain: Not on file  Food Insecurity: Not on file  Transportation Needs: Not on file  Physical Activity: Not on file  Stress: Not on file  Social Connections: Unknown (12/11/2021)   [facility]   Social Network    Social Network: Not on file  Intimate Partner Violence: Unknown (11/02/2021)   Received from [facility]   HITS    Physically Hurt: Not on file    Insult or Talk Down To: Not on file    Threaten Physical Harm: Not on file    Scream or Curse: Not on file    ROS      Objective    BP (!) 143/84   Pulse 73   Ht 6' 2 (1.88 m)   Wt (!) 303 lb (137.4 kg)   SpO2 96%  BMI 38.90 kg/m   Physical Exam Vitals and nursing note reviewed.  Constitutional:      Appearance: Normal appearance.  HENT:     Head: Normocephalic.     Nose: Nose normal.     Mouth/Throat:     Mouth: Mucous membranes are moist.  Cardiovascular:     Rate and Rhythm: Normal rate and regular rhythm.     Pulses: Normal pulses.     Heart sounds: Normal heart sounds.  Pulmonary:     Effort: Pulmonary effort is normal.     Breath sounds: Normal breath sounds.  Musculoskeletal:        General: Normal range of motion.     Cervical back: Normal range of motion and neck supple.  Skin:    General: Skin is warm and dry.  Neurological:     Mental Status: He is alert and oriented to person, place, and time.  Psychiatric:        Mood and Affect: Mood normal.        Behavior: Behavior normal.         Assessment & Plan:  1) Health maintenance - Patient set up for  screening colonoscopy appt with GI 2) DMII - fasting labs today - A1c, CMP, TSH, FLP 3) Follow up appt in 6 months   Problem List Items Addressed This Visit   None Visit Diagnoses       Screening for colon cancer    -  Primary       No follow-ups on file.   Neale Carpen, NP

## 2024-04-07 ENCOUNTER — Ambulatory Visit: Payer: Self-pay

## 2024-04-07 ENCOUNTER — Encounter (INDEPENDENT_AMBULATORY_CARE_PROVIDER_SITE_OTHER): Payer: Self-pay | Admitting: *Deleted

## 2024-04-07 LAB — CMP14+EGFR
ALT: 92 IU/L — ABNORMAL HIGH (ref 0–44)
AST: 43 IU/L — ABNORMAL HIGH (ref 0–40)
Albumin: 4.4 g/dL (ref 3.8–4.9)
Alkaline Phosphatase: 71 IU/L (ref 44–121)
BUN/Creatinine Ratio: 15 (ref 9–20)
BUN: 16 mg/dL (ref 6–24)
Bilirubin Total: 0.6 mg/dL (ref 0.0–1.2)
CO2: 20 mmol/L (ref 20–29)
Calcium: 9.4 mg/dL (ref 8.7–10.2)
Chloride: 102 mmol/L (ref 96–106)
Creatinine, Ser: 1.05 mg/dL (ref 0.76–1.27)
Globulin, Total: 2.7 g/dL (ref 1.5–4.5)
Glucose: 148 mg/dL — ABNORMAL HIGH (ref 70–99)
Potassium: 4.4 mmol/L (ref 3.5–5.2)
Sodium: 137 mmol/L (ref 134–144)
Total Protein: 7.1 g/dL (ref 6.0–8.5)
eGFR: 83 mL/min/1.73

## 2024-04-07 LAB — LIPID PANEL
Chol/HDL Ratio: 4.7 ratio (ref 0.0–5.0)
Cholesterol, Total: 117 mg/dL (ref 100–199)
HDL: 25 mg/dL — ABNORMAL LOW
LDL Chol Calc (NIH): 58 mg/dL (ref 0–99)
Triglycerides: 209 mg/dL — ABNORMAL HIGH (ref 0–149)
VLDL Cholesterol Cal: 34 mg/dL (ref 5–40)

## 2024-04-07 LAB — TSH+FREE T4
Free T4: 0.96 ng/dL (ref 0.82–1.77)
TSH: 1.39 u[IU]/mL (ref 0.450–4.500)

## 2024-04-07 LAB — HEMOGLOBIN A1C
Est. average glucose Bld gHb Est-mCnc: 174 mg/dL
Hgb A1c MFr Bld: 7.7 % — ABNORMAL HIGH (ref 4.8–5.6)

## 2024-04-13 ENCOUNTER — Ambulatory Visit (INDEPENDENT_AMBULATORY_CARE_PROVIDER_SITE_OTHER): Admitting: Mental Health

## 2024-04-13 DIAGNOSIS — F4323 Adjustment disorder with mixed anxiety and depressed mood: Secondary | ICD-10-CM

## 2024-04-13 NOTE — Progress Notes (Signed)
 [facility]  Name: Evan Reyes Date:  04/13/24 MRN: [id number] DOB: [birth year] PCP: Glennon Sand, NP  Time spent: 45 minutes Start time: 8: 07 a.m. stop time: 8: 52 AM  Treatment:   ind. therapy  Mental Status Exam:    Appearance:    Casual     Behavior:   Appropriate  Motor:   WNL  Speech/Language:    Clear and Coherent  Affect:   Full range   Mood:   Euthymic  Thought process:   Logical, linear, goal directed  Thought content:     WNL  Sensory/Perceptual disturbances:     none  Orientation:   x4  Attention:   Good  Concentration:   Good  Memory:   Intact  Fund of knowledge:    Consistent with age and development  Insight:     Good  Judgment:    Good  Impulse Control:   Good     Reported Symptoms:  some depressed mood, anger/irritability   Risk Assessment: Danger to Self:  No Self-injurious Behavior: No Danger to Others: No Duty to Warn:no Physical Aggression / Violence:No  Access to Firearms a concern: No  Gang Involvement:No  Patient / guardian was educated about steps to take if suicide or homicide risk level increases between visits: yes While future psychiatric events cannot be accurately predicted, the patient does not currently require acute inpatient psychiatric care and does not currently meet [place]  involuntary commitment criteria.   Medications: Current Outpatient Medications  Medication Sig Dispense Refill   aspirin 81 MG chewable tablet Chew by mouth.     emtricitabine-tenofovir (TRUVADA) 200-300 MG tablet Take 1 tablet by mouth daily.     glimepiride (AMARYL) 2 MG tablet Take 2 mg by mouth daily.     ibuprofen  (ADVIL ) 800 MG tablet Take 1 tablet (800 mg total) by mouth 3 (three) times daily. 30 tablet 0   losartan (COZAAR) 50 MG tablet losartan 50 mg tablet     meclizine  (ANTIVERT ) 12.5 MG tablet Take 1 tablet (12.5 mg total) by mouth 3 (three) times daily as needed for dizziness. Do not take with alcohol or  while driving or operating heavy machinery.  May cause drowsiness. (Patient not taking: Reported on 04/06/2024) 30 tablet 0   Multiple Vitamins-Minerals (THERA-M) TABS Take by mouth.     Omega-3 Fatty Acids (FISH OIL) 1000 MG CAPS Take by mouth.     simvastatin (ZOCOR) 10 MG tablet Take 10 mg by mouth daily.     No current facility-administered medications for this visit.      Subjective:   Patient presents for session on time.  Assessed recent events where patient shared he is currently on vacation and is off for about 1 week.  He plans to visit his mother later today and went on to share how he is adjusting to being off for the last couple of days.  He continues to plan on leaving his full-time job sometime this month, putting in notice soon.  He identified many reasons for making this decision, giving it careful thought over the last few months.  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  He shared thoughts, feelings and experiences related to the impact this has had on him, leading to this needed change.  Explored ways collaboratively to allow him to cope and  care for himself during the time of adjustment, continuing to utilize his support system.   Interventions:  supportive therapy, motivational interviewing, CBT   Diagnoses:    ICD-10-CM   1. Adjustment disorder with mixed anxiety and depressed mood  F43.23          Plan:  Patient is to utilize coping skills as discussed in session, utilize his support system, maintain his level of functioning by getting adequate rest, journal between sessions, continue to utilize his support system.  Long-term goals:   Maintain symptom reduction: The patient will report sustained reduction in symptoms of depression, irritability using both CBT and mindfulness interventions. Improve emotional regulation: The patient will  learn and apply CBT and mindfulness-based strategies to regulate emotions such as his tendency to get angry and report an improvement in emotional regulation for at least 3 consecutive months progressively.   Short-term goal:  The patient will learn and apply CBT and mindfulness-based coping skills for managing emotional distress and practice using it between sessions.       2.   The patient will CBT and mindfulness-based interventions to increase awareness of negative thought patterns and work to reframe them as needed.       3.   The patient will explore and identify cognitions that add to his feelings of unresolved anger and sadness           Lonni Fischer, [facility]

## 2024-05-11 ENCOUNTER — Ambulatory Visit (INDEPENDENT_AMBULATORY_CARE_PROVIDER_SITE_OTHER): Admitting: Mental Health

## 2024-05-15 DIAGNOSIS — Z113 Encounter for screening for infections with a predominantly sexual mode of transmission: Secondary | ICD-10-CM | POA: Diagnosis not present

## 2024-05-18 ENCOUNTER — Ambulatory Visit (INDEPENDENT_AMBULATORY_CARE_PROVIDER_SITE_OTHER): Admitting: Mental Health

## 2024-05-18 DIAGNOSIS — F4323 Adjustment disorder with mixed anxiety and depressed mood: Secondary | ICD-10-CM | POA: Diagnosis not present

## 2024-05-18 NOTE — Progress Notes (Signed)
 [facility]  Name: Evan Reyes Date:  05/18/24 MRN: [id number] DOB: [birth year] PCP: Glennon Sand, NP (Inactive)  Time spent: 47 minutes Start time: 8: 00 a.m. stop time: 8: 47 AM  Treatment:   ind. therapy  Mental Status Exam:    Appearance:    Casual     Behavior:   Appropriate  Motor:   WNL  Speech/Language:    Clear and Coherent  Affect:   Full range   Mood:   Euthymic  Thought process:   Logical, linear, goal directed  Thought content:     WNL  Sensory/Perceptual disturbances:     none  Orientation:   x4  Attention:   Good  Concentration:   Good  Memory:   Intact  Fund of knowledge:    Consistent with age and development  Insight:     Good  Judgment:    Good  Impulse Control:   Good     Reported Symptoms:  some depressed mood, anger/irritability   Risk Assessment: Danger to Self:  No Self-injurious Behavior: No Danger to Others: No Duty to Warn:no Physical Aggression / Violence:No  Access to Firearms a concern: No  Gang Involvement:No  Patient / guardian was educated about steps to take if suicide or homicide risk level increases between visits: yes While future psychiatric events cannot be accurately predicted, the patient does not currently require acute inpatient psychiatric care and does not currently meet [place]  involuntary commitment criteria.   Medications: Current Outpatient Medications  Medication Sig Dispense Refill   aspirin 81 MG chewable tablet Chew by mouth.     emtricitabine-tenofovir (TRUVADA) 200-300 MG tablet Take 1 tablet by mouth daily.     glimepiride (AMARYL) 2 MG tablet Take 2 mg by mouth daily.     ibuprofen  (ADVIL ) 800 MG tablet Take 1 tablet (800 mg total) by mouth 3 (three) times daily. 30 tablet 0   losartan (COZAAR) 50 MG tablet losartan 50 mg tablet     meclizine  (ANTIVERT ) 12.5 MG tablet Take 1 tablet (12.5 mg total) by mouth 3 (three) times daily as needed for dizziness. Do not take with  alcohol or while driving or operating heavy machinery.  May cause drowsiness. (Patient not taking: Reported on 04/06/2024) 30 tablet 0   Multiple Vitamins-Minerals (THERA-M) TABS Take by mouth.     Omega-3 Fatty Acids (FISH OIL) 1000 MG CAPS Take by mouth.     simvastatin (ZOCOR) 10 MG tablet Take 10 mg by mouth daily.     No current facility-administered medications for this visit.      Subjective:   Patient presents for session on time.  Patient shared recent events, most notably one of his close friends is now in the hospital and potentially only has months to live.  Most of the session was centered around his trying to be there for his friend, how she has limited support which also includes family.  He stated that he is trying to be supportive, understanding while also trying to facilitate her family being more involved.  At this point, he expresses some hopefulness about her having these difficult conversations with them.  He went on to share how he is had to cope with several losses in his life, each of them difficult, most notably the loss of his father years ago.  Facilitated his identifying feelings related, his tendency to feel frustration, at times anger although expressing his ability to persevere which he questions at times due to all  he is had to go through.  He was able to identify his compassion for others, his he is not showing care and support when people need him although this is difficult for him to except from others.  Encouraged journaling for further insight into why this is difficult for him.    Interventions:  supportive therapy, motivational interviewing, CBT   Diagnoses:    ICD-10-CM   1. Adjustment disorder with mixed anxiety and depressed mood  F43.23           Plan:  Patient is to utilize coping skills as discussed in session, utilize his support system, maintain his level of functioning by getting adequate rest, journal between sessions, continue to utilize  his support system.  Long-term goals:   Maintain symptom reduction: The patient will report sustained reduction in symptoms of depression, irritability using both CBT and mindfulness interventions. Improve emotional regulation: The patient will learn and apply CBT and mindfulness-based strategies to regulate emotions such as his tendency to get angry and report an improvement in emotional regulation for at least 3 consecutive months progressively.   Short-term goal:  The patient will learn and apply CBT and mindfulness-based coping skills for managing emotional distress and practice using it between sessions.       2.   The patient will CBT and mindfulness-based interventions to increase awareness of negative thought patterns and work to reframe them as needed.       3.   The patient will explore and identify cognitions that add to his feelings of unresolved anger and sadness           Lonni Fischer, [facility]

## 2024-06-15 ENCOUNTER — Ambulatory Visit (INDEPENDENT_AMBULATORY_CARE_PROVIDER_SITE_OTHER): Admitting: Mental Health

## 2024-07-13 ENCOUNTER — Ambulatory Visit (INDEPENDENT_AMBULATORY_CARE_PROVIDER_SITE_OTHER): Admitting: Mental Health

## 2024-07-13 DIAGNOSIS — F4323 Adjustment disorder with mixed anxiety and depressed mood: Secondary | ICD-10-CM

## 2024-07-13 NOTE — Progress Notes (Signed)
 [facility]  Name: Evan Reyes Date: 07/13/24 MRN: [id number] DOB: 03-17-1966 PCP: Glennon Sand, NP (Inactive)  Time spent: 48 minutes Start time: 8: 00 a.m. stop time: 8: 48 AM  Treatment:   ind. therapy  Mental Status Exam:    Appearance:    Casual     Behavior:   Appropriate  Motor:   WNL  Speech/Language:    Clear and Coherent  Affect:   Full range   Mood:   Euthymic  Thought process:   Logical, linear, goal directed  Thought content:     WNL  Sensory/Perceptual disturbances:     none  Orientation:   x4  Attention:   Good  Concentration:   Good  Memory:   Intact  Fund of knowledge:    Consistent with age and development  Insight:     Good  Judgment:    Good  Impulse Control:   Good     Reported Symptoms:  some depressed mood, anger/irritability   Risk Assessment: Danger to Self:  No Self-injurious Behavior: No Danger to Others: No Duty to Warn:no Physical Aggression / Violence:No  Access to Firearms a concern: No  Gang Involvement:No  Patient / guardian was educated about steps to take if suicide or homicide risk level increases between visits: yes While future psychiatric events cannot be accurately predicted, the patient does not currently require acute inpatient psychiatric care and does not currently meet [place]  involuntary commitment criteria.   Medications: Current Outpatient Medications  Medication Sig Dispense Refill   aspirin 81 MG chewable tablet Chew by mouth.     emtricitabine-tenofovir (TRUVADA) 200-300 MG tablet Take 1 tablet by mouth daily.     glimepiride (AMARYL) 2 MG tablet Take 2 mg by mouth daily.     ibuprofen  (ADVIL ) 800 MG tablet Take 1 tablet (800 mg total) by mouth 3 (three) times daily. 30 tablet 0   losartan (COZAAR) 50 MG tablet losartan 50 mg tablet     meclizine  (ANTIVERT ) 12.5 MG tablet Take 1 tablet (12.5 mg total) by mouth 3 (three) times daily as needed for dizziness. Do not take with  alcohol or while driving or operating heavy machinery.  May cause drowsiness. (Patient not taking: Reported on 04/06/2024) 30 tablet 0   Multiple Vitamins-Minerals (THERA-M) TABS Take by mouth.     Omega-3 Fatty Acids (FISH OIL) 1000 MG CAPS Take by mouth.     simvastatin (ZOCOR) 10 MG tablet Take 10 mg by mouth daily.     No current facility-administered medications for this visit.      Subjective:   Patient presents for session on time.  Assessed progress since last visit which was about 2 months ago.  He shared how he continues to adjust as he quit his job about 2 months ago.  He stated that he has been continuing to look into a different career path.  He stated this was somewhat interrupted recently as he has been assisting his elderly mother as she is in the process of transitioning to live with his sister.  Ways he continues to maintain boundaries in these relationships with his mother and sister which shared, specifically when they have discussions where they disagree.  He stated that recently he was able to remove himself from the situation which was helpful, effective towards managing his stress.  His roommate remains in the hospital with ongoing serious medical conditions where he stated that she potentially only has months to live.  Provide support  and understanding and facilitated his identifying needs where he plans to allow for her immediate family, her adult sons to make plans and preparations for her needs.  He shared how he has had some financial stress related to medicalize his insurance rates recently increased.  He was able to focus on the importance of meeting these needs and adjusting.   Interventions:  supportive therapy, motivational interviewing, CBT   Diagnoses:    ICD-10-CM   1. Adjustment disorder with mixed anxiety and depressed mood  F43.23         Plan:  Patient is to utilize coping skills as discussed in session, utilize his support system, maintain his level of  functioning by getting adequate rest, journal between sessions, continue to utilize his support system.  Long-term goals:   Maintain symptom reduction: The patient will report sustained reduction in symptoms of depression, irritability using both CBT and mindfulness interventions. Improve emotional regulation: The patient will learn and apply CBT and mindfulness-based strategies to regulate emotions such as his tendency to get angry and report an improvement in emotional regulation for at least 3 consecutive months progressively.   Short-term goal:  The patient will learn and apply CBT and mindfulness-based coping skills for managing emotional distress and practice using it between sessions.       2.   The patient will CBT and mindfulness-based interventions to increase awareness of negative thought patterns and work to reframe them as needed.       3.   The patient will explore and identify cognitions that add to his feelings of unresolved anger and sadness           Lonni Fischer, [facility]

## 2024-08-17 ENCOUNTER — Ambulatory Visit (INDEPENDENT_AMBULATORY_CARE_PROVIDER_SITE_OTHER): Admitting: Mental Health

## 2024-08-17 DIAGNOSIS — F4323 Adjustment disorder with mixed anxiety and depressed mood: Secondary | ICD-10-CM | POA: Diagnosis not present

## 2024-08-17 NOTE — Progress Notes (Signed)
 [facility] note  Name: Evan Reyes Date: 08/17/24 MRN: [id number] DOB: 09/28/1965 PCP: Glennon Sand, NP (Inactive)  Time spent: 47 minutes Start time: 8: 00 a.m. stop time: 8: 47 AM  Treatment:   ind. therapy  Mental Status Exam:    Appearance:    Casual     Behavior:   Appropriate  Motor:   WNL  Speech/Language:    Clear and Coherent  Affect:   Full range   Mood:   Euthymic  Thought process:   Logical, linear, goal directed  Thought content:     WNL  Sensory/Perceptual disturbances:     none  Orientation:   x4  Attention:   Good  Concentration:   Good  Memory:   Intact  Fund of knowledge:    Consistent with age and development  Insight:     Good  Judgment:    Good  Impulse Control:   Good     Reported Symptoms:  some depressed mood, anger/irritability   Risk Assessment: Danger to Self:  No Self-injurious Behavior: No Danger to Others: No Duty to Warn:no Physical Aggression / Violence:No  Access to Firearms a concern: No  Gang Involvement:No  Patient / guardian was educated about steps to take if suicide or homicide risk level increases between visits: yes While future psychiatric events cannot be accurately predicted, the patient does not currently require acute inpatient psychiatric care and does not currently meet [place]  involuntary commitment criteria.   Medications: Current Outpatient Medications  Medication Sig Dispense Refill   aspirin 81 MG chewable tablet Chew by mouth.     emtricitabine-tenofovir (TRUVADA) 200-300 MG tablet Take 1 tablet by mouth daily.     glimepiride (AMARYL) 2 MG tablet Take 2 mg by mouth daily.     ibuprofen  (ADVIL ) 800 MG tablet Take 1 tablet (800 mg total) by mouth 3 (three) times daily. 30 tablet 0   losartan (COZAAR) 50 MG tablet losartan 50 mg tablet     meclizine  (ANTIVERT ) 12.5 MG tablet Take 1 tablet (12.5 mg total) by mouth 3 (three) times daily as needed for dizziness. Do not take with  alcohol or while driving or operating heavy machinery.  May cause drowsiness. (Patient not taking: Reported on 04/06/2024) 30 tablet 0   Multiple Vitamins-Minerals (THERA-M) TABS Take by mouth.     Omega-3 Fatty Acids (FISH OIL) 1000 MG CAPS Take by mouth.     simvastatin (ZOCOR) 10 MG tablet Take 10 mg by mouth daily.     No current facility-administered medications for this visit.      Subjective:   Patient presents for session on time.  Patient shared recent stressors.  He detailed experiences with his girlfriends and close friends over the holidays.  He stated that he was able to set some boundaries and some of the relationships, 1 specifically due to some arguments that had taken place.  He provided contact, how one of his girlfriends being taken advantage of by another mutual friend.  He went on to share challenges of the relationship with his sister, her calling him recently, yelling at him due to his not wanting his daughter to visit with their mother due to her potentially having the flu.  He shared how he was able to be calm and did not respond in a similar manner back to his sister, rather he avoided engaging in the discussion due to her behavior.  He further did not respond to her daughter who had texted him  recently making what he considers a moral judgment against him and made other derogatory and judgmental comments.  He was able to share how he plans to maintain these types of boundaries and his relationships as needed.  He went on to further recognize how he has a choice to not respond particularly to others if he does not consider them to be his close support system.  Interventions:  supportive therapy, motivational interviewing, CBT   Diagnoses:    ICD-10-CM   1. Adjustment disorder with mixed anxiety and depressed mood  F43.23          Plan:  Patient is to utilize coping skills as discussed in session, utilize his support system, maintain his level of functioning by  getting adequate rest, journal between sessions, continue to utilize his support system.  Long-term goals:   Maintain symptom reduction: The patient will report sustained reduction in symptoms of depression, irritability using both CBT and mindfulness interventions. Improve emotional regulation: The patient will learn and apply CBT and mindfulness-based strategies to regulate emotions such as his tendency to get angry and report an improvement in emotional regulation for at least 3 consecutive months progressively.   Short-term goal:  The patient will learn and apply CBT and mindfulness-based coping skills for managing emotional distress and practice using it between sessions.       2.   The patient will CBT and mindfulness-based interventions to increase awareness of negative thought patterns and work to reframe them as needed.       3.   The patient will explore and identify cognitions that add to his feelings of unresolved anger and sadness           Lonni Fischer, [facility]

## 2024-09-14 ENCOUNTER — Ambulatory Visit: Admitting: Mental Health

## 2024-10-05 ENCOUNTER — Ambulatory Visit: Admitting: Family Medicine

## 2024-10-05 ENCOUNTER — Ambulatory Visit: Admitting: Nurse Practitioner

## 2024-10-12 ENCOUNTER — Ambulatory Visit: Admitting: Mental Health
